# Patient Record
Sex: Male | Born: 1985 | Race: Black or African American | Hispanic: No | Marital: Single | Smoking: Current every day smoker
Health system: Southern US, Community
[De-identification: ages and names within clinical notes are randomized; demographics above are authoritative.]

## PROBLEM LIST (undated history)

## (undated) DIAGNOSIS — W3400XA Accidental discharge from unspecified firearms or gun, initial encounter: Secondary | ICD-10-CM

## (undated) HISTORY — PX: OTHER SURGICAL HISTORY: SHX169

---
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
## 2003-08-09 ENCOUNTER — Emergency Department (HOSPITAL_COMMUNITY): Admission: EM | Admit: 2003-08-09 | Discharge: 2003-08-10 | Payer: Self-pay | Admitting: Emergency Medicine

## 2004-04-16 ENCOUNTER — Emergency Department (HOSPITAL_COMMUNITY): Admission: EM | Admit: 2004-04-16 | Discharge: 2004-04-16 | Payer: Self-pay | Admitting: Emergency Medicine

## 2005-06-10 ENCOUNTER — Emergency Department (HOSPITAL_COMMUNITY): Admission: EM | Admit: 2005-06-10 | Discharge: 2005-06-10 | Payer: Self-pay | Admitting: Emergency Medicine

## 2005-10-15 ENCOUNTER — Emergency Department (HOSPITAL_COMMUNITY): Admission: EM | Admit: 2005-10-15 | Discharge: 2005-10-15 | Payer: Self-pay | Admitting: Emergency Medicine

## 2006-07-26 ENCOUNTER — Emergency Department (HOSPITAL_COMMUNITY): Admission: EM | Admit: 2006-07-26 | Discharge: 2006-07-26 | Payer: Self-pay | Admitting: Emergency Medicine

## 2014-05-04 ENCOUNTER — Emergency Department (HOSPITAL_COMMUNITY)
Admission: EM | Admit: 2014-05-04 | Discharge: 2014-05-04 | Disposition: A | Discharge status: Home / Self Care | Payer: Self-pay | Attending: Emergency Medicine | Admitting: Emergency Medicine

## 2014-05-04 ENCOUNTER — Encounter (HOSPITAL_COMMUNITY): Payer: Self-pay | Admitting: *Deleted

## 2014-05-04 DIAGNOSIS — Z202 Contact with and (suspected) exposure to infections with a predominantly sexual mode of transmission: Secondary | ICD-10-CM | POA: Insufficient documentation

## 2014-05-04 DIAGNOSIS — R109 Unspecified abdominal pain: Secondary | ICD-10-CM | POA: Insufficient documentation

## 2014-05-04 DIAGNOSIS — N342 Other urethritis: Secondary | ICD-10-CM

## 2014-05-04 DIAGNOSIS — Z720 Tobacco use: Secondary | ICD-10-CM | POA: Insufficient documentation

## 2014-05-04 DIAGNOSIS — A64 Unspecified sexually transmitted disease: Secondary | ICD-10-CM

## 2014-05-04 DIAGNOSIS — R1110 Vomiting, unspecified: Secondary | ICD-10-CM

## 2014-05-04 DIAGNOSIS — R112 Nausea with vomiting, unspecified: Secondary | ICD-10-CM | POA: Insufficient documentation

## 2014-05-04 DIAGNOSIS — Z880 Allergy status to penicillin: Secondary | ICD-10-CM | POA: Insufficient documentation

## 2014-05-04 HISTORY — DX: Accidental discharge from unspecified firearms or gun, initial encounter: W34.00XA

## 2014-05-04 LAB — CBC
HEMATOCRIT: 42.2 % (ref 39.0–52.0)
Hemoglobin: 14 g/dL (ref 13.0–17.0)
MCH: 28.5 pg (ref 26.0–34.0)
MCHC: 33.2 g/dL (ref 30.0–36.0)
MCV: 85.8 fL (ref 78.0–100.0)
Platelets: 237 10*3/uL (ref 150–400)
RBC: 4.92 MIL/uL (ref 4.22–5.81)
RDW: 11.9 % (ref 11.5–15.5)
WBC: 6.8 10*3/uL (ref 4.0–10.5)

## 2014-05-04 LAB — I-STAT CHEM 8, ED
BUN: 12 mg/dL (ref 6–23)
CREATININE: 1.2 mg/dL (ref 0.50–1.35)
Calcium, Ion: 1.18 mmol/L (ref 1.12–1.23)
Chloride: 105 mEq/L (ref 96–112)
Glucose, Bld: 98 mg/dL (ref 70–99)
HEMATOCRIT: 46 % (ref 39.0–52.0)
HEMOGLOBIN: 15.6 g/dL (ref 13.0–17.0)
POTASSIUM: 3.6 meq/L — AB (ref 3.7–5.3)
SODIUM: 142 meq/L (ref 137–147)
TCO2: 26 mmol/L (ref 0–100)

## 2014-05-04 LAB — URINALYSIS, ROUTINE W REFLEX MICROSCOPIC
Bilirubin Urine: NEGATIVE
Glucose, UA: NEGATIVE mg/dL
HGB URINE DIPSTICK: NEGATIVE
Ketones, ur: NEGATIVE mg/dL
Leukocytes, UA: NEGATIVE
NITRITE: NEGATIVE
Protein, ur: NEGATIVE mg/dL
SPECIFIC GRAVITY, URINE: 1.025 (ref 1.005–1.030)
Urobilinogen, UA: 0.2 mg/dL (ref 0.0–1.0)
pH: 5.5 (ref 5.0–8.0)

## 2014-05-04 MED ORDER — DOXYCYCLINE HYCLATE 100 MG PO CAPS: 100.0000 mg | ORAL_CAPSULE | Freq: Two times a day (BID) | ORAL | Status: DC

## 2014-05-04 MED ORDER — PROMETHAZINE HCL 25 MG PO TABS: 25.0000 mg | ORAL_TABLET | Freq: Four times a day (QID) | ORAL | Status: DC | PRN

## 2014-05-04 MED ORDER — AZITHROMYCIN 250 MG PO TABS
1000.0000 mg | ORAL_TABLET | Freq: Once | ORAL | Status: AC
  Administered 2014-05-04: 1000 mg via ORAL
  Filled 2014-05-04: qty 4

## 2014-05-04 MED ORDER — LIDOCAINE HCL (PF) 1 % IJ SOLN
0.9000 mL | Freq: Once | INTRAMUSCULAR | Status: AC
  Administered 2014-05-04: 0.9 mL
  Filled 2014-05-04: qty 5

## 2014-05-04 MED ORDER — METRONIDAZOLE 500 MG PO TABS
2000.0000 mg | ORAL_TABLET | Freq: Once | ORAL | Status: AC
  Administered 2014-05-04: 2000 mg via ORAL
  Filled 2014-05-04: qty 4

## 2014-05-04 MED ORDER — CEFTRIAXONE SODIUM 250 MG IJ SOLR
250.0000 mg | Freq: Once | INTRAMUSCULAR | Status: AC
  Administered 2014-05-04: 250 mg via INTRAMUSCULAR
  Filled 2014-05-04: qty 250

## 2014-05-04 NOTE — ED Notes (Signed)
Vomiting x 2 days. Penile d/c. Yellow drainage. Sexually active, unprotected sex.

## 2014-05-04 NOTE — ED Notes (Signed)
No adverse reaction to medications/injection

## 2014-05-04 NOTE — Discharge Instructions (Signed)
Nausea and Vomiting Nausea is a sick feeling that often comes before throwing up (vomiting). Vomiting is a reflex where stomach contents come out of your mouth. Vomiting can cause severe loss of body fluids (dehydration). Children and elderly adults can become dehydrated quickly, especially if they also have diarrhea. Nausea and vomiting are symptoms of a condition or disease. It is important to find the cause of your symptoms. CAUSES   Direct irritation of the stomach lining. This irritation can result from increased acid production (gastroesophageal reflux disease), infection, food poisoning, taking certain medicines (such as nonsteroidal anti-inflammatory drugs), alcohol use, or tobacco use.  Signals from the brain.These signals could be caused by a headache, heat exposure, an inner ear disturbance, increased pressure in the brain from injury, infection, a tumor, or a concussion, pain, emotional stimulus, or metabolic problems.  An obstruction in the gastrointestinal tract (bowel obstruction).  Illnesses such as diabetes, hepatitis, gallbladder problems, appendicitis, kidney problems, cancer, sepsis, atypical symptoms of a heart attack, or eating disorders.  Medical treatments such as chemotherapy and radiation.  Receiving medicine that makes you sleep (general anesthetic) during surgery. DIAGNOSIS Your caregiver may ask for tests to be done if the problems do not improve after a few days. Tests may also be done if symptoms are severe or if the reason for the nausea and vomiting is not clear. Tests may include:  Urine tests.  Blood tests.  Stool tests.  Cultures (to look for evidence of infection).  X-rays or other imaging studies. Test results can help your caregiver make decisions about treatment or the need for additional tests. TREATMENT You need to stay well hydrated. Drink frequently but in small amounts.You may wish to drink water, sports drinks, clear broth, or eat frozen  ice pops or gelatin dessert to help stay hydrated.When you eat, eating slowly may help prevent nausea.There are also some antinausea medicines that may help prevent nausea. HOME CARE INSTRUCTIONS   Take all medicine as directed by your caregiver.  If you do not have an appetite, do not force yourself to eat. However, you must continue to drink fluids.  If you have an appetite, eat a normal diet unless your caregiver tells you differently.  Eat a variety of complex carbohydrates (rice, wheat, potatoes, bread), lean meats, yogurt, fruits, and vegetables.  Avoid high-fat foods because they are more difficult to digest.  Drink enough water and fluids to keep your urine clear or pale yellow.  If you are dehydrated, ask your caregiver for specific rehydration instructions. Signs of dehydration may include:  Severe thirst.  Dry lips and mouth.  Dizziness.  Dark urine.  Decreasing urine frequency and amount.  Confusion.  Rapid breathing or pulse. SEEK IMMEDIATE MEDICAL CARE IF:   You have blood or brown flecks (like coffee grounds) in your vomit.  You have black or bloody stools.  You have a severe headache or stiff neck.  You are confused.  You have severe abdominal pain.  You have chest pain or trouble breathing.  You do not urinate at least once every 8 hours.  You develop cold or clammy skin.  You continue to vomit for longer than 24 to 48 hours.  You have a fever. MAKE SURE YOU:   Understand these instructions.  Will watch your condition.  Will get help right away if you are not doing well or get worse. Document Released: 06/04/2005 Document Revised: 08/27/2011 Document Reviewed: 11/01/2010 ExitCare Patient Information 2015 [employer]. This information is not intended   to replace advice given to you by your health care provider. Make sure you discuss any questions you have with your health care provider.  Sexually Transmitted Disease A sexually  transmitted disease (STD) is a disease or infection often passed to another person during sex. However, STDs can be passed through nonsexual ways. An STD can be passed through:  Spit (saliva).  Semen.  Blood.  Mucus from the vagina.  Pee (urine). HOW CAN I LESSEN MY CHANCES OF GETTING AN STD?  Use:  Latex condoms.  Water-soluble lubricants with condoms. Do not use petroleum jelly or oils.  Dental dams. These are small pieces of latex that are used as a barrier during oral sex.  Avoid having more than one sex partner.  Do not have sex with someone who has other sex partners.  Do not have sex with anyone you do not know or who is at high risk for an STD.  Avoid risky sex that can break your skin.  Do not have sex if you have open sores on your mouth or skin.  Avoid drinking too much alcohol or taking illegal drugs. Alcohol and drugs can affect your good judgment.  Avoid oral and anal sex acts.  Get shots (vaccines) for HPV and hepatitis.  If you are at risk of being infected with HIV, it is advised that you take a certain medicine daily to prevent HIV infection. This is called pre-exposure prophylaxis (PrEP). You may be at risk if:  You are a man who has sex with other men (MSM).  You are attracted to the opposite sex (heterosexual) and are having sex with more than one partner.  You take drugs with a needle.  You have sex with someone who has HIV.  Talk with your doctor about if you are at high risk of being infected with HIV. If you begin to take PrEP, get tested for HIV first. Get tested every 3 months for as long as you are taking PrEP. WHAT SHOULD I DO IF I THINK I HAVE AN STD?  See your doctor.  Tell your sex partner(s) that you have an STD. They should be tested and treated.  Do not have sex until your doctor says it is okay. WHEN SHOULD I GET HELP? Get help right away if:  You have bad belly (abdominal) pain.  You are a man and have puffiness  (swelling) or pain in your testicles.  You are a woman and have puffiness in your vagina. Document Released: 07/12/2004 Document Revised: 06/09/2013 Document Reviewed: 11/28/2012 [facility] Patient Information 2015 [employer], [place]. This information is not intended to replace advice given to you by your health care provider. Make sure you discuss any questions you have with your health care provider.
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
## 2014-06-09 ENCOUNTER — Emergency Department (HOSPITAL_COMMUNITY): Payer: Self-pay

## 2014-06-09 ENCOUNTER — Encounter (HOSPITAL_COMMUNITY): Payer: Self-pay | Admitting: Emergency Medicine

## 2014-06-09 ENCOUNTER — Emergency Department (HOSPITAL_COMMUNITY)
Admission: EM | Admit: 2014-06-09 | Discharge: 2014-06-09 | Disposition: A | Discharge status: Home / Self Care | Payer: Self-pay | Attending: Emergency Medicine | Admitting: Emergency Medicine

## 2014-06-09 DIAGNOSIS — Z720 Tobacco use: Secondary | ICD-10-CM | POA: Insufficient documentation

## 2014-06-09 DIAGNOSIS — Y92410 Unspecified street and highway as the place of occurrence of the external cause: Secondary | ICD-10-CM | POA: Insufficient documentation

## 2014-06-09 DIAGNOSIS — Y9389 Activity, other specified: Secondary | ICD-10-CM | POA: Insufficient documentation

## 2014-06-09 DIAGNOSIS — S0001XA Abrasion of scalp, initial encounter: Secondary | ICD-10-CM | POA: Insufficient documentation

## 2014-06-09 DIAGNOSIS — S060X9A Concussion with loss of consciousness of unspecified duration, initial encounter: Secondary | ICD-10-CM

## 2014-06-09 DIAGNOSIS — Z880 Allergy status to penicillin: Secondary | ICD-10-CM | POA: Insufficient documentation

## 2014-06-09 DIAGNOSIS — Z87828 Personal history of other (healed) physical injury and trauma: Secondary | ICD-10-CM | POA: Insufficient documentation

## 2014-06-09 DIAGNOSIS — Z792 Long term (current) use of antibiotics: Secondary | ICD-10-CM | POA: Insufficient documentation

## 2014-06-09 DIAGNOSIS — T148 Other injury of unspecified body region: Secondary | ICD-10-CM | POA: Insufficient documentation

## 2014-06-09 DIAGNOSIS — Y998 Other external cause status: Secondary | ICD-10-CM | POA: Insufficient documentation

## 2014-06-09 DIAGNOSIS — S0081XA Abrasion of other part of head, initial encounter: Secondary | ICD-10-CM | POA: Insufficient documentation

## 2014-06-09 DIAGNOSIS — T148XXA Other injury of unspecified body region, initial encounter: Secondary | ICD-10-CM

## 2014-06-09 DIAGNOSIS — S060X0A Concussion without loss of consciousness, initial encounter: Secondary | ICD-10-CM | POA: Insufficient documentation

## 2014-06-09 LAB — COMPREHENSIVE METABOLIC PANEL
ALT: 27 U/L (ref 0–53)
AST: 32 U/L (ref 0–37)
Albumin: 4.6 g/dL (ref 3.5–5.2)
Alkaline Phosphatase: 65 U/L (ref 39–117)
Anion gap: 12 (ref 5–15)
BUN: 8 mg/dL (ref 6–23)
CO2: 25 mmol/L (ref 19–32)
Calcium: 9.6 mg/dL (ref 8.4–10.5)
Chloride: 101 mEq/L (ref 96–112)
Creatinine, Ser: 1.45 mg/dL — ABNORMAL HIGH (ref 0.50–1.35)
GFR calc Af Amer: 74 mL/min — ABNORMAL LOW (ref >90)
GFR calc non Af Amer: 64 mL/min — ABNORMAL LOW (ref >90)
Glucose, Bld: 106 mg/dL — ABNORMAL HIGH (ref 70–99)
POTASSIUM: 3 mmol/L — AB (ref 3.5–5.1)
SODIUM: 138 mmol/L (ref 135–145)
TOTAL PROTEIN: 7.8 g/dL (ref 6.0–8.3)
Total Bilirubin: 1 mg/dL (ref 0.3–1.2)

## 2014-06-09 LAB — CBC WITH DIFFERENTIAL/PLATELET
Basophils Absolute: 0 10*3/uL (ref 0.0–0.1)
Basophils Relative: 0 % (ref 0–1)
EOS ABS: 0.3 10*3/uL (ref 0.0–0.7)
Eosinophils Relative: 4 % (ref 0–5)
HEMATOCRIT: 43.6 % (ref 39.0–52.0)
HEMOGLOBIN: 15.4 g/dL (ref 13.0–17.0)
Lymphocytes Relative: 27 % (ref 12–46)
Lymphs Abs: 1.8 10*3/uL (ref 0.7–4.0)
MCH: 30 pg (ref 26.0–34.0)
MCHC: 35.3 g/dL (ref 30.0–36.0)
MCV: 85 fL (ref 78.0–100.0)
MONO ABS: 1 10*3/uL (ref 0.1–1.0)
MONOS PCT: 15 % — AB (ref 3–12)
NEUTROS ABS: 3.5 10*3/uL (ref 1.7–7.7)
Neutrophils Relative %: 54 % (ref 43–77)
Platelets: 264 10*3/uL (ref 150–400)
RBC: 5.13 MIL/uL (ref 4.22–5.81)
RDW: 11.7 % (ref 11.5–15.5)
WBC: 6.5 10*3/uL (ref 4.0–10.5)

## 2014-06-09 LAB — ETHANOL: Alcohol, Ethyl (B): <5 mg/dL (ref 0–9)

## 2014-06-09 MED ORDER — SODIUM CHLORIDE 0.9 % IV BOLUS (SEPSIS)
1000.0000 mL | Freq: Once | INTRAVENOUS | Status: AC
  Administered 2014-06-09: 1000 mL via INTRAVENOUS

## 2014-06-09 MED ORDER — TETANUS-DIPHTH-ACELL PERTUSSIS 5-2.5-18.5 LF-MCG/0.5 IM SUSP
0.5000 mL | Freq: Once | INTRAMUSCULAR | Status: DC
  Filled 2014-06-09: qty 0.5

## 2014-06-09 MED ORDER — IBUPROFEN 800 MG PO TABS: 800.0000 mg | ORAL_TABLET | Freq: Three times a day (TID) | ORAL | Status: DC

## 2014-06-09 MED ORDER — METHOCARBAMOL 750 MG PO TABS: 750.0000 mg | ORAL_TABLET | Freq: Three times a day (TID) | ORAL | Status: DC | PRN

## 2014-06-09 MED ORDER — MORPHINE SULFATE 4 MG/ML IJ SOLN
4.0000 mg | Freq: Once | INTRAMUSCULAR | Status: AC
Start: 2014-06-09 — End: 2014-06-09
  Administered 2014-06-09: 4 mg via INTRAVENOUS
  Filled 2014-06-09: qty 1

## 2014-06-09 MED ORDER — ONDANSETRON HCL 4 MG/2ML IJ SOLN
4.0000 mg | Freq: Once | INTRAMUSCULAR | Status: AC
  Administered 2014-06-09: 4 mg via INTRAVENOUS
  Filled 2014-06-09: qty 2

## 2014-06-09 MED ORDER — HYDROCODONE-ACETAMINOPHEN 5-325 MG PO TABS: 2.0000 | ORAL_TABLET | ORAL | Status: DC | PRN
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
## 2014-06-09 NOTE — ED Notes (Signed)
[employer] and Security at the room.

## 2014-06-09 NOTE — ED Notes (Signed)
Patient returned from xray.
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
## 2014-06-09 NOTE — ED Notes (Signed)
Breakfast Tray ordered  
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
## 2014-06-09 NOTE — ED Notes (Signed)
Pt. Refused to be ambulated out of the room.

## 2015-02-04 ENCOUNTER — Encounter (HOSPITAL_COMMUNITY): Payer: Self-pay | Admitting: Nurse Practitioner

## 2015-02-04 ENCOUNTER — Emergency Department (HOSPITAL_COMMUNITY)
Admission: EM | Admit: 2015-02-04 | Discharge: 2015-02-05 | Disposition: A | Discharge status: Other Acute Inpatient Hospital | Payer: Self-pay | Attending: Emergency Medicine | Admitting: Emergency Medicine

## 2015-02-04 DIAGNOSIS — T39312A Poisoning by propionic acid derivatives, intentional self-harm, initial encounter: Secondary | ICD-10-CM | POA: Insufficient documentation

## 2015-02-04 DIAGNOSIS — T50902A Poisoning by unspecified drugs, medicaments and biological substances, intentional self-harm, initial encounter: Secondary | ICD-10-CM

## 2015-02-04 DIAGNOSIS — T404X2A Poisoning by other synthetic narcotics, intentional self-harm, initial encounter: Secondary | ICD-10-CM | POA: Insufficient documentation

## 2015-02-04 DIAGNOSIS — Z791 Long term (current) use of non-steroidal anti-inflammatories (NSAID): Secondary | ICD-10-CM | POA: Insufficient documentation

## 2015-02-04 DIAGNOSIS — Z720 Tobacco use: Secondary | ICD-10-CM | POA: Insufficient documentation

## 2015-02-04 DIAGNOSIS — Y939 Activity, unspecified: Secondary | ICD-10-CM | POA: Insufficient documentation

## 2015-02-04 DIAGNOSIS — Y929 Unspecified place or not applicable: Secondary | ICD-10-CM | POA: Insufficient documentation

## 2015-02-04 DIAGNOSIS — Y999 Unspecified external cause status: Secondary | ICD-10-CM | POA: Insufficient documentation

## 2015-02-04 DIAGNOSIS — Z87828 Personal history of other (healed) physical injury and trauma: Secondary | ICD-10-CM | POA: Insufficient documentation

## 2015-02-04 DIAGNOSIS — Z880 Allergy status to penicillin: Secondary | ICD-10-CM | POA: Insufficient documentation

## 2015-02-04 DIAGNOSIS — F329 Major depressive disorder, single episode, unspecified: Secondary | ICD-10-CM | POA: Insufficient documentation

## 2015-02-04 DIAGNOSIS — X58XXXA Exposure to other specified factors, initial encounter: Secondary | ICD-10-CM | POA: Insufficient documentation

## 2015-02-04 LAB — RAPID URINE DRUG SCREEN, HOSP PERFORMED
Amphetamines: NOT DETECTED
Barbiturates: NOT DETECTED
Benzodiazepines: NOT DETECTED
COCAINE: NOT DETECTED
OPIATES: NOT DETECTED
TETRAHYDROCANNABINOL: NOT DETECTED

## 2015-02-04 LAB — CBC WITH DIFFERENTIAL/PLATELET
Basophils Absolute: 0 10*3/uL (ref 0.0–0.1)
Basophils Relative: 0 % (ref 0–1)
EOS PCT: 2 % (ref 0–5)
Eosinophils Absolute: 0.2 10*3/uL (ref 0.0–0.7)
HCT: 40.5 % (ref 39.0–52.0)
Hemoglobin: 13.9 g/dL (ref 13.0–17.0)
LYMPHS ABS: 3 10*3/uL (ref 0.7–4.0)
LYMPHS PCT: 35 % (ref 12–46)
MCH: 29.5 pg (ref 26.0–34.0)
MCHC: 34.3 g/dL (ref 30.0–36.0)
MCV: 86 fL (ref 78.0–100.0)
MONO ABS: 0.8 10*3/uL (ref 0.1–1.0)
Monocytes Relative: 9 % (ref 3–12)
Neutro Abs: 4.6 10*3/uL (ref 1.7–7.7)
Neutrophils Relative %: 54 % (ref 43–77)
PLATELETS: 229 10*3/uL (ref 150–400)
RBC: 4.71 MIL/uL (ref 4.22–5.81)
RDW: 11.5 % (ref 11.5–15.5)
WBC: 8.6 10*3/uL (ref 4.0–10.5)

## 2015-02-04 LAB — COMPREHENSIVE METABOLIC PANEL
ALT: 17 U/L (ref 17–63)
AST: 22 U/L (ref 15–41)
Albumin: 4.2 g/dL (ref 3.5–5.0)
Alkaline Phosphatase: 53 U/L (ref 38–126)
Anion gap: 9 (ref 5–15)
BUN: 9 mg/dL (ref 6–20)
CHLORIDE: 106 mmol/L (ref 101–111)
CO2: 26 mmol/L (ref 22–32)
Calcium: 9.2 mg/dL (ref 8.9–10.3)
Creatinine, Ser: 1.1 mg/dL (ref 0.61–1.24)
GFR calc Af Amer: >60 mL/min (ref >60)
Glucose, Bld: 111 mg/dL — ABNORMAL HIGH (ref 65–99)
POTASSIUM: 3.3 mmol/L — AB (ref 3.5–5.1)
Sodium: 141 mmol/L (ref 135–145)
Total Bilirubin: 0.9 mg/dL (ref 0.3–1.2)
Total Protein: 6.9 g/dL (ref 6.5–8.1)

## 2015-02-04 LAB — ETHANOL

## 2015-02-04 LAB — SALICYLATE LEVEL: Salicylate Lvl: <4 mg/dL (ref 2.8–30.0)

## 2015-02-04 LAB — ACETAMINOPHEN LEVEL: Acetaminophen (Tylenol), Serum: <10 ug/mL — ABNORMAL LOW (ref 10–30)

## 2015-02-04 NOTE — ED Notes (Signed)
MD at bedside. 
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
## 2015-02-04 NOTE — ED Notes (Signed)
Pt family states he took 91  tramadol pills, 6-7  ibuprofen pills, and an unknown amount of naproxen this morning in an attempt for suicide. He denies HI. He is lethargic but breathing easily now. He denies physical complaints. He denies substance use.

## 2015-02-04 NOTE — ED Notes (Addendum)
Poison control contacted.  Advised to monitor for GI upset, bradycardia, hypotension, respiratory depression.  Monitor for 4-6 hrs and check Acetaminophen level. Adela Lank MD made aware of recommendations.
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
## 2015-02-04 NOTE — BH Assessment (Signed)
Received notification of TTS consult request. Spoke to Melene Plan, DO who said Pt overdosed on medications in a suicide attempt. Tele-assessment will be initiated.  Harlin Rain Patsy [place], LPC, [facility], [facility] Triage Specialist [phone]
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
## 2015-02-05 ENCOUNTER — Inpatient Hospital Stay (HOSPITAL_COMMUNITY)
Admission: AD | Admit: 2015-02-05 | Discharge: 2015-02-06 | DRG: 882 | Disposition: A | Discharge status: Home / Self Care | Payer: Federal, State, Local not specified - Other | Source: Intra-hospital | Attending: Psychiatry | Admitting: Psychiatry

## 2015-02-05 ENCOUNTER — Encounter (HOSPITAL_COMMUNITY): Payer: Self-pay

## 2015-02-05 DIAGNOSIS — Z9079 Acquired absence of other genital organ(s): Secondary | ICD-10-CM | POA: Diagnosis present

## 2015-02-05 DIAGNOSIS — T6592XA Toxic effect of unspecified substance, intentional self-harm, initial encounter: Secondary | ICD-10-CM

## 2015-02-05 DIAGNOSIS — F4323 Adjustment disorder with mixed anxiety and depressed mood: Secondary | ICD-10-CM | POA: Diagnosis not present

## 2015-02-05 DIAGNOSIS — F17210 Nicotine dependence, cigarettes, uncomplicated: Secondary | ICD-10-CM | POA: Diagnosis present

## 2015-02-05 DIAGNOSIS — F439 Reaction to severe stress, unspecified: Secondary | ICD-10-CM | POA: Diagnosis present

## 2015-02-05 DIAGNOSIS — F329 Major depressive disorder, single episode, unspecified: Secondary | ICD-10-CM | POA: Insufficient documentation

## 2015-02-05 DIAGNOSIS — T50902A Poisoning by unspecified drugs, medicaments and biological substances, intentional self-harm, initial encounter: Secondary | ICD-10-CM

## 2015-02-05 DIAGNOSIS — F32A Depression, unspecified: Secondary | ICD-10-CM | POA: Insufficient documentation

## 2015-02-05 DIAGNOSIS — F430 Acute stress reaction: Secondary | ICD-10-CM

## 2015-02-05 MED ORDER — ALUM & MAG HYDROXIDE-SIMETH 200-200-20 MG/5ML PO SUSP: 30.0000 mL | ORAL | Status: DC | PRN

## 2015-02-05 MED ORDER — DOXYCYCLINE HYCLATE 100 MG PO TABS
100.0000 mg | ORAL_TABLET | Freq: Two times a day (BID) | ORAL | Status: DC
  Filled 2015-02-05 (×3): qty 1

## 2015-02-05 MED ORDER — POTASSIUM CHLORIDE CRYS ER 10 MEQ PO TBCR
10.0000 meq | EXTENDED_RELEASE_TABLET | Freq: Two times a day (BID) | ORAL | Status: DC
  Filled 2015-02-05 (×4): qty 1

## 2015-02-05 MED ORDER — ACETAMINOPHEN 325 MG PO TABS: 650.0000 mg | ORAL_TABLET | Freq: Four times a day (QID) | ORAL | Status: DC | PRN

## 2015-02-05 MED ORDER — MAGNESIUM HYDROXIDE 400 MG/5ML PO SUSP
30.0000 mL | Freq: Every day | ORAL | Status: DC | PRN
  Administered 2015-02-06: 30 mL via ORAL
  Filled 2015-02-05: qty 30

## 2015-02-05 NOTE — Progress Notes (Signed)
Adult Psychoeducational Group Note  Date:  02/05/2015 Time:  10:08 PM  Group Topic/Focus:  Wrap-Up Group:   The focus of this group is to help patients review their daily goal of treatment and discuss progress on daily workbooks.  Participation Level:  Active  Participation Quality:  Appropriate  Affect:  Appropriate  Cognitive:  Appropriate  Insight: Appropriate  Engagement in Group:  Engaged  Modes of Intervention:  Discussion  Additional Comments:  The patient expressed the he had a rough day.The patient also said that he did not attended any groups.  Octavio Manns 02/05/2015, 10:08 PM
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
## 2015-02-05 NOTE — Tx Team (Signed)
Initial Interdisciplinary Treatment Plan   PATIENT STRESSORS: did not disclose any stressors to Clinical research associate.    PATIENT STRENGTHS: Average or above average intelligence General fund of knowledge Physical Health   PROBLEM LIST: Problem List/Patient Goals Date to be addressed Date deferred Reason deferred Estimated date of resolution  " to go home" 8/20     Increased risk for SI 8/20                                                 DISCHARGE CRITERIA:  Improved stabilization in mood, thinking, and/or behavior Need for constant or close observation no longer present Reduction of life-threatening or endangering symptoms to within safe limits  PRELIMINARY DISCHARGE PLAN: Outpatient therapy Return to previous living arrangement Return to previous work or school arrangements  PATIENT/FAMIILY INVOLVEMENT: This treatment plan has been presented to and reviewed with the patient, Patrick Oneill.  The patient and family have been given the opportunity to ask questions and make suggestions.  Tymesha Ditmore A 02/05/2015, 5:44 AM
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
## 2015-02-05 NOTE — [facility] Suicide Risk Assessment (Signed)
[facility] Admission Suicide Risk Assessment   Nursing information obtained from:  Patient Demographic factors:  Male Current Mental Status:  Self-harm behaviors Loss Factors:  Legal issues, Financial problems / change in socioeconomic status Historical Factors:  Prior suicide attempts Risk Reduction Factors:  Responsible for children under 31 years of age, Living with another person, especially a relative Total Time spent with patient: 30 minutes Principal Problem: Adjustment disorder with mixed anxiety and depressed mood Diagnosis:   Patient Active Problem List   Diagnosis Date Noted  . Suicide attempt by substance overdose [T65.92XA] 02/05/2015  . Ineffective individual coping [F43.0] 02/05/2015  . Adjustment disorder with mixed anxiety and depressed mood [F43.23] 02/05/2015     Continued Clinical Symptoms:  Alcohol Use Disorder Identification Test Final Score (AUDIT): 0 The "Alcohol Use Disorders Identification Test", Guidelines for Use in Primary Care, Second Edition.  World Science writer [facility]). Score between 0-7:  no or low risk or alcohol related problems. Score between 8-15:  moderate risk of alcohol related problems. Score between 16-19:  high risk of alcohol related problems. Score 20 or above:  warrants further diagnostic evaluation for alcohol dependence and treatment.   CLINICAL FACTORS:   Alcohol/Substance Abuse/Dependencies   Musculoskeletal: Strength & Muscle Tone: within normal limits Gait & Station: normal Patient leans: Right  Psychiatric Specialty Exam: Physical Exam  Review of Systems  Psychiatric/Behavioral: Positive for substance abuse. Negative for depression, suicidal ideas and hallucinations. The patient is not nervous/anxious.   All other systems reviewed and are negative.   Blood pressure 132/85, pulse 67, temperature 97.6 F (36.4 C), temperature source Oral, resp. rate 18.There is no weight on file to calculate BMI.  General Appearance:  Fairly Groomed  Patent attorney::  Good  Speech:  Clear and Coherent  Volume:  Normal  Mood:  Euthymic  Affect:  Appropriate  Thought Process:  Coherent  Orientation:  Full (Time, Place, and Person)  Thought Content:  WDL  Suicidal Thoughts:  No s/p od  Homicidal Thoughts:  No  Memory:  Immediate;   Fair Recent;   Fair Remote;   Fair  Judgement:  Impaired  Insight:  Fair  Psychomotor Activity:  Normal  Concentration:  Fair  Recall:  [employer] of Knowledge:Fair  Language: Fair  Akathisia:  No  Handed:  Right  AIMS (if indicated):     Assets:  Communication Skills Desire for Improvement  Sleep:     Cognition: WNL  ADL's:  Intact     COGNITIVE FEATURES THAT CONTRIBUTE TO RISK:  Closed-mindedness, Polarized thinking and Thought constriction (tunnel vision)    SUICIDE RISK:   Mild:  Suicidal ideation of limited frequency, intensity, duration, and specificity.  There are no identifiable plans, no associated intent, mild dysphoria and related symptoms, good self-control (both objective and subjective assessment), few other risk factors, and identifiable protective factors, including available and accessible social support.  PLAN OF CARE: Patient will benefit from inpatient treatment and stabilization.  Estimated length of stay is 5-7 days.  Reviewed past medical records,treatment plan.  Pt at this time is not open to any medications - states he OD ed on pills impulsively and it was not to kill self.   Will continue to monitor vitals ,medication compliance and treatment side effects while patient is here.  Will monitor for medical issues as well as call consult as needed.  Reviewed labs cbc, cmp - k low, uds neg. Replace Kdur 10 meq x2 doses , repeat BMP . CSW will  start working on disposition.  Patient to participate in therapeutic milieu .       Medical Decision Making:  Review of Psycho-Social Stressors (1), Review or order clinical lab tests (1), Review of Last Therapy  Session (1), Review of Medication Regimen & Side Effects (2) and Review of New Medication or Change in Dosage (2)  I certify that inpatient services furnished can reasonably be expected to improve the patient's condition.   Maguire Sime MD 02/05/2015, 2:57 PM

## 2015-02-05 NOTE — [facility] Group Notes (Signed)
[facility] Group Notes: (Clinical Social Work)   02/05/2015      Type of Therapy:  Group Therapy   Participation Level:  Did Not Attend despite MHT prompting   Ambrose Mantle, LCSW 02/05/2015, 4:33 PM

## 2015-02-05 NOTE — Plan of Care (Signed)
Problem: Ineffective individual coping Goal: STG: Patient will remain free from self harm Outcome: Progressing Q 15 minutes checks remains effective without gestures or event of self injurious behavior to note at this time. Pt safe on and off the unit.
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
## 2015-02-05 NOTE — [facility] Group Notes (Signed)
[facility] Group Notes:  (Nursing/Healthy Coping Skills)  Date:  02/05/2015  Time:  0930 Type of Therapy:  Nurse Education  Participation Level:  Did Not Attend  Summary of Progress/Problems: Pt did not attend scheduled group--Pt was completing his school work online in the treatment room as approved by [facility] who was 1:1 with him at the time.   Ouida Sills, Lincoln Maxin 02/05/2015, 0930

## 2015-02-05 NOTE — ED Notes (Signed)
The [facility] officer has arrived with ivc papers and is now waiting for other officers to transport the pt

## 2015-02-05 NOTE — Progress Notes (Signed)
D: Pt presents with angry affect and irritable /labile mood when approached. Pt denied SI, HI, AVH and pain when assessed. Pt has been oppositional, defensive and demanding D/C for most of this shift and has been uncooperative with care at intervals during shift. Pt refused all medications when offered. Stayed in treatment room earlier this shift on 1:1 observation with [facility] to complete school work on the computer.  A: 1:1 contact made with pt to conduct shift assessment. All medications offered to pt as prescribed with verbal education to promote compliance but to no avail as pt continued to refuse. Emotional support and availability offered to pt. Q 15 minutes checks maintained for safety. R: Pt remains safe on and off unit. Denies adverse drug reactions when assessed. Plan of care continues.

## 2015-02-05 NOTE — Progress Notes (Signed)
Writer spoke with patient briefly because he reports that he is discharging on tomorrow and he does not want any medications. He refused his lab draw even after writer explained the purpose of his lab. He denies si/hi/a/v hallucinations. Safety maintained on unit with 15 min checks.

## 2015-02-06 MED ORDER — HYDROXYZINE HCL 50 MG PO TABS
ORAL_TABLET | ORAL | Status: AC
  Filled 2015-02-06: qty 2

## 2015-02-06 MED ORDER — HYDROXYZINE HCL 25 MG PO TABS
25.0000 mg | ORAL_TABLET | Freq: Four times a day (QID) | ORAL | Status: DC | PRN
  Filled 2015-02-06: qty 10

## 2015-02-06 MED ORDER — HYDROXYZINE HCL 50 MG PO TABS
100.0000 mg | ORAL_TABLET | Freq: Four times a day (QID) | ORAL | Status: DC | PRN
  Filled 2015-02-06: qty 30

## 2015-02-06 MED ORDER — HYDROXYZINE PAMOATE 25 MG PO CAPS: 100.0000 mg | ORAL_CAPSULE | Freq: Four times a day (QID) | ORAL | Status: DC | PRN

## 2015-02-06 MED ORDER — HYDROXYZINE PAMOATE 25 MG PO CAPS: 25.0000 mg | ORAL_CAPSULE | Freq: Four times a day (QID) | ORAL | Status: AC | PRN

## 2015-02-06 MED ORDER — HYDROXYZINE HCL 25 MG PO TABS
25.0000 mg | ORAL_TABLET | Freq: Once | ORAL | Status: AC
  Administered 2015-02-06: 25 mg via ORAL

## 2015-02-06 MED ORDER — HYDROXYZINE HCL 25 MG PO TABS
ORAL_TABLET | ORAL | Status: AC
  Filled 2015-02-06: qty 1

## 2015-02-06 NOTE — Plan of Care (Signed)
Problem: Diagnosis: Increased Risk For Suicide Attempt Goal: STG-Patient Will Attend All Groups On The Unit Outcome: Progressing Patient attended wrap-up group but reports that he did not attend any other groups during the day.

## 2015-02-06 NOTE — Progress Notes (Signed)
Pair of tennis shoes with laces have been placed in his locker (locker #[id number]).

## 2015-02-06 NOTE — [facility] Suicide Risk Assessment (Signed)
[facility] INPATIENT:  Family/Significant Other Suicide Prevention Education  Suicide Prevention Education:  Education Completed with patient's wife, Rennis Golden at [phone] has been identified by the patient as the family member/significant other with whom the patient will be residing, and identified as the person(s) who will aid the patient in the event of a mental health crisis (suicidal ideations/suicide attempt).  With written consent from the patient, the family member/significant other has been provided the following suicide prevention education, prior to the and/or following the discharge of the patient. Patient's wife has spoken with patient and believes he had to intent to harm himself and is ready for discharge.   The suicide prevention education provided includes the following:  Suicide risk factors  Suicide prevention and interventions  National Suicide Hotline telephone number  [facility] assessment telephone number  [facility] Emergency Assistance 911  [facility] and/or Residential Mobile Crisis Unit telephone number  Request made of family/significant other to:  Remove weapons (e.g., guns, rifles, knives), all items previously/currently identified as safety concern.    Remove drugs/medications (over-the-counter, prescriptions, illicit drugs), all items previously/currently identified as a safety concern.  The family member/significant other verbalizes understanding of the suicide prevention education information provided.  The family member/significant other agrees to remove the items of safety concern listed above.  Clide Dales 02/06/2015, 9:30 AM
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
## 2015-02-06 NOTE — [facility] Suicide Risk Assessment (Signed)
[facility] Discharge Suicide Risk Assessment   Demographic Factors:  Male  Total Time spent with patient: 30 minutes  Musculoskeletal: Strength & Muscle Tone: within normal limits Gait & Station: normal Patient leans: N/A  Psychiatric Specialty Exam: Physical Exam  Review of Systems  Psychiatric/Behavioral: Negative for depression, suicidal ideas and hallucinations. The patient is not nervous/anxious.   All other systems reviewed and are negative.   Blood pressure 121/86, pulse 58, temperature 98.6 F (37 C), temperature source Oral, resp. rate 20.There is no weight on file to calculate BMI.  General Appearance: Casual  Eye Contact::  Fair  Speech:  Clear and Coherent409  Volume:  Normal  Mood:  Anxious  Affect:  Appropriate  Thought Process:  Coherent  Orientation:  Full (Time, Place, and Person)  Thought Content:  WDL  Suicidal Thoughts:  No  Homicidal Thoughts:  No  Memory:  Immediate;   Fair Recent;   Fair Remote;   Fair  Judgement:  Fair  Insight:  Shallow  Psychomotor Activity:  Normal  Concentration:  Fair  Recall:  [employer] of Knowledge:Fair  Language: Fair  Akathisia:  No  Handed:  Right  AIMS (if indicated):     Assets:  Communication Skills Desire for Improvement  Sleep:  Number of Hours: 6  Cognition: WNL  ADL's:  Intact   Have you used any form of tobacco in the last 30 days? (Cigarettes, Smokeless Tobacco, Cigars, and/or Pipes): Yes  Has this patient used any form of tobacco in the last 30 days? (Cigarettes, Smokeless Tobacco, Cigars, and/or Pipes) Yes, A prescription for an FDA-approved tobacco cessation medication was offered at discharge and the patient refused  Mental Status Per Nursing Assessment::   On Admission:  Self-harm behaviors  Current Mental Status by Physician: pt denies SI/HI/AH/VH  Loss Factors: NA  Historical Factors: Impulsivity  Risk Reduction Factors:   Positive social support  Continued Clinical Symptoms:   Alcohol/Substance Abuse/Dependencies  Cognitive Features That Contribute To Risk:  Thought constriction (tunnel vision)    Suicide Risk:  Minimal: No identifiable suicidal ideation.  Patients presenting with no risk factors but with morbid ruminations; may be classified as minimal risk based on the severity of the depressive symptoms  Principal Problem: Adjustment disorder with mixed anxiety and depressed mood Discharge Diagnoses:  Patient Active Problem List   Diagnosis Date Noted  . Suicide attempt by substance overdose [T65.92XA] 02/05/2015  . Ineffective individual coping [F43.0] 02/05/2015  . Adjustment disorder with mixed anxiety and depressed mood [F43.23] 02/05/2015    Follow-up Information    Go to [facility].   Specialty:  Behavioral Health   Why:  For followup assessment to get therapy appointments in place; be there BEFORE 1:00 PM on Friday (8/26 as Friday's are their slowest days)   Contact information:   [address] [place] [address] [phone]       Plan Of Care/Follow-up recommendations:  Activity:  No restrictions Diet:  regular Tests:  none Other:  follow up with after care  Is patient on multiple antipsychotic therapies at discharge:  No   Has Patient had three or more failed trials of antipsychotic monotherapy by history:  No  Recommended Plan for Multiple Antipsychotic Therapies: NA    Kynsli Haapala md 02/06/2015, 9:25 AM
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
## 2015-02-06 NOTE — [facility] Counselor (Signed)
Adult Comprehensive Assessment  Patient ID: Patrick Oneill, male   DOB: July 18, 1985, 29 Y.[place]   MRN: [id number]  Information Source: Information source: Patient  Current Stressors:  Educational / Learning stressors: Online course work is time consuming and stressful in addition to working full time plus overtime hours Employment / Job issues: Stress of working multiple shifts while also taking online classes Family Relationships: NA Surveyor, quantity / Lack of resources (include bankruptcy): "Okay, not great but okay" Housing / Lack of housing: NA Physical health (include injuries & life threatening diseases): NA Social relationships: Minimal time for social relationships other than church friends Substance abuse: NA; pt has court case in early September for selling drugs yet does not use himself Bereavement / Loss: NA  Living/Environment/Situation:  Living Arrangements: Spouse/significant other Living conditions (as described by patient or guardian): Pt reports both he and wife have separate dwellings; he spends majority of time at wife's How long has patient lived in current situation?: 8 months What is atmosphere in current home: Comfortable, Chaotic (some chaos with both work and school commitments)  Family History:  Marital status: Married Number of Years Married: 8 What types of issues is patient dealing with in the relationship?: NA; good support Does patient have children?: Yes How many children?: 1 How is patient's relationship with their children?: Good with 7 YO sone  Childhood History:  By whom was/is the patient raised?: Grandparents Additional childhood history information: Pt lived with grandmother for majority of childhood other than brief stay with mother that did not go well Description of patient's relationship with caregiver when they were a child: Good with Grandmother; minimal contact with bio mother Patient's description of current relationship with people who raised  him/her: Good with grandmother (currently in hospital re diabetes); no contact with bio mother Does patient have siblings?: Yes Number of Siblings: 7 Description of patient's current relationship with siblings: "Good with maybe one" Did patient suffer any verbal/emotional/physical/sexual abuse as a child?: No Did patient suffer from severe childhood neglect?: No Has patient ever been sexually abused/assaulted/raped as an adolescent or adult?: No Was the patient ever a victim of a crime or a disaster?: No Witnessed domestic violence?: No Has patient been effected by domestic violence as an adult?: No  Education:  Highest grade of school patient has completed: Some college Currently a student?: Yes If yes, how has current illness impacted academic performance: Missed classes Friday Name of school: [employer] Contact person: Patient How long has the patient attended?: 1 year Learning disability?: No  Employment/Work Situation:   Employment situation: Employed 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 How long has patient been employed?: 1 year Patient's job has been impacted by current illness: No ((other than missing work Friday 02/04/15)) What is the longest time patient has a held a job?: 2 years Has patient ever been in the [employer]?: No Has patient ever served in Buyer, retail?: No  Financial Resources:   Surveyor, quantity resources: Income from employment, Income from spouse Does patient have a representative payee or guardian?: No  Alcohol/Substance Abuse:   What has been your use of drugs/alcohol within the last 12 months?: Pt denies Alcohol/Substance Abuse Treatment Hx: Denies past history Has alcohol/substance abuse ever caused legal problems?: No (Pt does have pending court case (02/18/15) for sale of drugs but reports no drug use)  Social Support System:   Patient's Community Support System: Fair Development worker, community Support System: Wife Type of faith/religion:  Jehovah's Witness How does patient's faith help  to cope with current illness?: Church attendance and study helps  Leisure/Recreation:   Leisure and Hobbies: No time now with work, school, family and church  Strengths/Needs:   What things does the patient do well?: Primary school teacher, spouse, and student In what areas does patient struggle / problems for patient: Stress of 'doing it all'  Discharge Plan:   Does patient have access to transportation?: Yes Will patient be returning to same living situation after discharge?: Yes Currently receiving community mental health services: No If no, would patient like referral for services when discharged?: Yes (What county?) Medical sales representative) Does patient have financial barriers related to discharge medications?: Yes Patient description of barriers related to discharge medications: No insurance; given Associate Professor  Summary/Recommendations:   Summary and Recommendations (to be completed by the evaluator): 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 Patient denies any intention of self harm or suicide and reports he did take twice the amount of his usual medications than usual due to stress and desire to sleep and was unable to clearly communicate this while at ED with physician or social worker. Patient was to be discharged shortly after writer spoke with him to gather information; patient agreeable to share wife's contact information for CSW to call and provide safety information.  Patient refused referral to [facility] Quitline and refused to sign Discharge Process and Patient Expectations information sheet although CSW did go over informational sheet.     Clide Dales. 02/06/2015

## 2015-02-06 NOTE — Progress Notes (Signed)
  [facility] Adult Case Management Discharge Plan :  Will you be returning to the same living situation after discharge:  Yes,  home with wife At discharge, do you have transportation home?: Yes,  with wife. Do you have the ability to pay for your medications: Yes,  through reduced cost at [facility]  Release of information consent forms completed and in the chart;  Patient's signature needed at discharge.  Patient to Follow up at: Follow-up Information    Go to [facility].   Specialty:  Behavioral Health   Why:  For followup assessment to get therapy (and med management) appointments in place; be there BEFORE 1:00 PM on Friday (8/26 as Friday's are their slowest days)   Contact information:   [address] [place] [address] [phone]        Safety Planning and Suicide Prevention discussed: Yes,  individually with both patient and wife, Rennis Golden.  Have you used any form of tobacco in the last 30 days? (Cigarettes, Smokeless Tobacco, Cigars, and/or Pipes): Yes  Has patient been referred to the Quitline?: Patient refused referral  Clide Dales 02/06/2015, 9:32 AM

## 2015-02-27 ENCOUNTER — Encounter (HOSPITAL_COMMUNITY): Payer: Self-pay | Admitting: *Deleted

## 2015-02-27 ENCOUNTER — Emergency Department (HOSPITAL_COMMUNITY)
Admission: EM | Admit: 2015-02-27 | Discharge: 2015-02-27 | Disposition: A | Discharge status: Home / Self Care | Payer: Self-pay | Attending: Emergency Medicine | Admitting: Emergency Medicine

## 2015-02-27 DIAGNOSIS — W540XXA Bitten by dog, initial encounter: Secondary | ICD-10-CM | POA: Insufficient documentation

## 2015-02-27 DIAGNOSIS — Y9289 Other specified places as the place of occurrence of the external cause: Secondary | ICD-10-CM | POA: Insufficient documentation

## 2015-02-27 DIAGNOSIS — Z720 Tobacco use: Secondary | ICD-10-CM | POA: Insufficient documentation

## 2015-02-27 DIAGNOSIS — S0081XA Abrasion of other part of head, initial encounter: Secondary | ICD-10-CM | POA: Insufficient documentation

## 2015-02-27 DIAGNOSIS — Y998 Other external cause status: Secondary | ICD-10-CM | POA: Insufficient documentation

## 2015-02-27 DIAGNOSIS — Y9389 Activity, other specified: Secondary | ICD-10-CM | POA: Insufficient documentation

## 2015-02-27 DIAGNOSIS — S60511A Abrasion of right hand, initial encounter: Secondary | ICD-10-CM | POA: Insufficient documentation

## 2015-02-27 DIAGNOSIS — Z880 Allergy status to penicillin: Secondary | ICD-10-CM | POA: Insufficient documentation

## 2015-02-27 DIAGNOSIS — S61451A Open bite of right hand, initial encounter: Secondary | ICD-10-CM | POA: Insufficient documentation

## 2015-02-27 DIAGNOSIS — S0990XA Unspecified injury of head, initial encounter: Secondary | ICD-10-CM | POA: Insufficient documentation

## 2015-02-27 DIAGNOSIS — Z23 Encounter for immunization: Secondary | ICD-10-CM | POA: Insufficient documentation

## 2015-02-27 MED ORDER — TETANUS-DIPHTH-ACELL PERTUSSIS 5-2.5-18.5 LF-MCG/0.5 IM SUSP
0.5000 mL | Freq: Once | INTRAMUSCULAR | Status: AC
  Administered 2015-02-27: 0.5 mL via INTRAMUSCULAR
  Filled 2015-02-27: qty 0.5

## 2015-02-27 MED ORDER — SULFAMETHOXAZOLE-TRIMETHOPRIM 800-160 MG PO TABS: 1.0000 | ORAL_TABLET | Freq: Two times a day (BID) | ORAL | Status: AC

## 2015-02-27 MED ORDER — METRONIDAZOLE 500 MG PO TABS: 500.0000 mg | ORAL_TABLET | Freq: Three times a day (TID) | ORAL | Status: AC
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
## 2015-02-27 NOTE — ED Notes (Signed)
Pt reports a dog from his apt complex jumped on his chest causing him to fall hitting his head on concrete today and bit him in his R middle, ring and pinky fingers.  4 puncture marks noted.  No active bleeding noted at this time.

## 2015-02-27 NOTE — Discharge Instructions (Signed)
Ibuprofen for pain. Keep clean and dry. Wash with soap and water. Watch for any signs of infection. Take both antibiotics as prescribed until all gone for infection prevention. Ice your head. Follow up as needed. Return if any signs of infection.    Animal Bite An animal bite can result in a scratch on the skin, deep open cut, puncture of the skin, crush injury, or tearing away of the skin or a body part. Dogs are responsible for most animal bites. Children are bitten more often than adults. An animal bite can range from very mild to more serious. A small bite from your house pet is no cause for alarm. However, some animal bites can become infected or injure a bone or other tissue. You must seek medical care if:  The skin is broken and bleeding does not slow down or stop after 15 minutes.  The puncture is deep and difficult to clean (such as a cat bite).  Pain, warmth, redness, or pus develops around the wound.  The bite is from a stray animal or rodent. There may be a risk of rabies infection.  The bite is from a snake, raccoon, skunk, fox, coyote, or bat. There may be a risk of rabies infection.  The person bitten has a chronic illness such as diabetes, liver disease, or cancer, or the person takes medicine that lowers the immune system.  There is concern about the location and severity of the bite. It is important to clean and protect an animal bite wound right away to prevent infection. Follow these steps:  Clean the wound with plenty of water and soap.  Apply an antibiotic cream.  Apply gentle pressure over the wound with a clean towel or gauze to slow or stop bleeding.  Elevate the affected area above the heart to help stop any bleeding.  Seek medical care. Getting medical care within 8 hours of the animal bite leads to the best possible outcome. DIAGNOSIS  Your caregiver will most likely:  Take a detailed history of the animal and the bite injury.  Perform a wound  exam.  Take your medical history. Blood tests or X-rays may be performed. Sometimes, infected bite wounds are cultured and sent to a lab to identify the infectious bacteria.  TREATMENT  Medical treatment will depend on the location and type of animal bite as well as the patient's medical history. Treatment may include:  Wound care, such as cleaning and flushing the wound with saline solution, bandaging, and elevating the affected area.  Antibiotics.  Tetanus immunization.  Rabies immunization.  Leaving the wound open to heal. This is often done with animal bites, due to the high risk of infection. However, in certain cases, wound closure with stitches, wound adhesive, skin adhesive strips, or staples may be used. Infected bites that are left untreated may require intravenous (IV) antibiotics and surgical treatment in the hospital. HOME CARE INSTRUCTIONS  Follow your caregiver's instructions for wound care.  Take all medicines as directed.  If your caregiver prescribes antibiotics, take them as directed. Finish them even if you start to feel better.  Follow up with your caregiver for further exams or immunizations as directed. You may need a tetanus shot if:  You cannot remember when you had your last tetanus shot.  You have never had a tetanus shot.  The injury broke your skin. If you get a tetanus shot, your arm may swell, get red, and feel warm to the touch. This is common and not  a problem. If you need a tetanus shot and you choose not to have one, there is a rare chance of getting tetanus. Sickness from tetanus can be serious. SEEK MEDICAL CARE IF:  You notice warmth, redness, soreness, swelling, pus discharge, or a bad smell coming from the wound.  You have a red line on the skin coming from the wound.  You have a fever, chills, or a general ill feeling.  You have nausea or vomiting.  You have continued or worsening pain.  You have trouble moving the injured  part.  You have other questions or concerns. MAKE SURE YOU:  Understand these instructions.  Will watch your condition.  Will get help right away if you are not doing well or get worse. Document Released: 02/20/2011 Document Revised: 08/27/2011 Document Reviewed: 02/20/2011 [facility] Patient Information 2015 [place], [place]. This information is not intended to replace advice given to you by your health care provider. Make sure you discuss any questions you have with your health care provider.  Head Injury You have received a head injury. It does not appear serious at this time. Headaches and vomiting are common following head injury. It should be easy to awaken from sleeping. Sometimes it is necessary for you to stay in the emergency department for a while for observation. Sometimes admission to the hospital may be needed. After injuries such as yours, most problems occur within the first 24 hours, but side effects may occur up to 7-10 days after the injury. It is important for you to carefully monitor your condition and contact your health care provider or seek immediate medical care if there is a change in your condition. WHAT ARE THE TYPES OF HEAD INJURIES? Head injuries can be as minor as a bump. Some head injuries can be more severe. More severe head injuries include:  A jarring injury to the brain (concussion).  A bruise of the brain (contusion). This mean there is bleeding in the brain that can cause swelling.  A cracked skull (skull fracture).  Bleeding in the brain that collects, clots, and forms a bump (hematoma). WHAT CAUSES A HEAD INJURY? A serious head injury is most likely to happen to someone who is in a car wreck and is not wearing a seat belt. Other causes of major head injuries include bicycle or motorcycle accidents, sports injuries, and falls. HOW ARE HEAD INJURIES DIAGNOSED? A complete history of the event leading to the injury and your current symptoms will be helpful  in diagnosing head injuries. Many times, pictures of the brain, such as CT or MRI are needed to see the extent of the injury. Often, an overnight hospital stay is necessary for observation.  WHEN SHOULD I SEEK IMMEDIATE MEDICAL CARE?  You should get help right away if:  You have confusion or drowsiness.  You feel sick to your stomach (nauseous) or have continued, forceful vomiting.  You have dizziness or unsteadiness that is getting worse.  You have severe, continued headaches not relieved by medicine. Only take over-the-counter or prescription medicines for pain, fever, or discomfort as directed by your health care provider.  You do not have normal function of the arms or legs or are unable to walk.  You notice changes in the black spots in the center of the colored part of your eye (pupil).  You have a clear or bloody fluid coming from your nose or ears.  You have a loss of vision. During the next 24 hours after the injury, you must  stay with someone who can watch you for the warning signs. This person should contact local emergency services (911 in the [place] if you have seizures, you become unconscious, or you are unable to wake up. HOW CAN I PREVENT A HEAD INJURY IN THE FUTURE? The most important factor for preventing major head injuries is avoiding motor vehicle accidents. To minimize the potential for damage to your head, it is crucial to wear seat belts while riding in motor vehicles. Wearing helmets while bike riding and playing collision sports (like football) is also helpful. Also, avoiding dangerous activities around the house will further help reduce your risk of head injury.  WHEN CAN I RETURN TO NORMAL ACTIVITIES AND ATHLETICS? You should be reevaluated by your health care provider before returning to these activities. If you have any of the following symptoms, you should not return to activities or contact sports until 1 week after the symptoms have stopped:  Persistent  headache.  Dizziness or vertigo.  Poor attention and concentration.  Confusion.  Memory problems.  Nausea or vomiting.  Fatigue or tire easily.  Irritability.  Intolerant of bright lights or loud noises.  Anxiety or depression.  Disturbed sleep. MAKE SURE YOU:   Understand these instructions.  Will watch your condition.  Will get help right away if you are not doing well or get worse. Document Released: 06/04/2005 Document Revised: 06/09/2013 Document Reviewed: 02/09/2013 [facility] Patient Information 2015 [place], [place]. This information is not intended to replace advice given to you by your health care provider. Make sure you discuss any questions you have with your health care provider.

## 2015-02-28 ENCOUNTER — Emergency Department (HOSPITAL_COMMUNITY)
Admission: EM | Admit: 2015-02-28 | Discharge: 2015-02-28 | Discharge status: Against Medical Advice | Payer: Self-pay | Attending: Emergency Medicine | Admitting: Emergency Medicine

## 2015-02-28 ENCOUNTER — Encounter (HOSPITAL_COMMUNITY): Payer: Self-pay | Admitting: Emergency Medicine

## 2015-02-28 DIAGNOSIS — R51 Headache: Secondary | ICD-10-CM | POA: Insufficient documentation

## 2015-02-28 DIAGNOSIS — Z720 Tobacco use: Secondary | ICD-10-CM | POA: Insufficient documentation
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
## 2015-02-28 NOTE — ED Notes (Signed)
No answer when called pt; unable to locate pt

## 2015-02-28 NOTE — ED Notes (Signed)
No answer when pt's name called, unable to locate pt 

## 2015-02-28 NOTE — ED Notes (Signed)
No answer when pt's name called in the lobby, unable to locate pt, pt eloped from the lobby

## 2015-08-28 ENCOUNTER — Emergency Department (HOSPITAL_COMMUNITY)
Admission: EM | Admit: 2015-08-28 | Discharge: 2015-08-28 | Disposition: A | Discharge status: Home / Self Care | Payer: Self-pay | Attending: Emergency Medicine | Admitting: Emergency Medicine

## 2015-08-28 ENCOUNTER — Emergency Department (HOSPITAL_COMMUNITY): Payer: Self-pay

## 2015-08-28 ENCOUNTER — Encounter (HOSPITAL_COMMUNITY): Payer: Self-pay | Admitting: Emergency Medicine

## 2015-08-28 DIAGNOSIS — Y9289 Other specified places as the place of occurrence of the external cause: Secondary | ICD-10-CM | POA: Insufficient documentation

## 2015-08-28 DIAGNOSIS — W01198A Fall on same level from slipping, tripping and stumbling with subsequent striking against other object, initial encounter: Secondary | ICD-10-CM | POA: Insufficient documentation

## 2015-08-28 DIAGNOSIS — W19XXXA Unspecified fall, initial encounter: Secondary | ICD-10-CM

## 2015-08-28 DIAGNOSIS — Y998 Other external cause status: Secondary | ICD-10-CM | POA: Insufficient documentation

## 2015-08-28 DIAGNOSIS — Z79899 Other long term (current) drug therapy: Secondary | ICD-10-CM | POA: Insufficient documentation

## 2015-08-28 DIAGNOSIS — F17210 Nicotine dependence, cigarettes, uncomplicated: Secondary | ICD-10-CM | POA: Insufficient documentation

## 2015-08-28 DIAGNOSIS — Z880 Allergy status to penicillin: Secondary | ICD-10-CM | POA: Insufficient documentation

## 2015-08-28 DIAGNOSIS — Y9389 Activity, other specified: Secondary | ICD-10-CM | POA: Insufficient documentation

## 2015-08-28 DIAGNOSIS — S0993XA Unspecified injury of face, initial encounter: Secondary | ICD-10-CM | POA: Insufficient documentation

## 2015-08-28 DIAGNOSIS — S0011XA Contusion of right eyelid and periocular area, initial encounter: Secondary | ICD-10-CM | POA: Insufficient documentation

## 2015-08-28 MED ORDER — SULFAMETHOXAZOLE-TRIMETHOPRIM 800-160 MG PO TABS
1.0000 | ORAL_TABLET | Freq: Once | ORAL | Status: AC
  Administered 2015-08-28: 1 via ORAL
  Filled 2015-08-28: qty 1

## 2015-08-28 MED ORDER — LIDOCAINE HCL 2 % IJ SOLN
10.0000 mL | Freq: Once | INTRAMUSCULAR | Status: AC
Start: 2015-08-28 — End: 2015-08-28
  Administered 2015-08-28: 10 mg via INTRADERMAL
  Filled 2015-08-28: qty 20

## 2015-08-28 MED ORDER — MORPHINE SULFATE (PF) 4 MG/ML IV SOLN
4.0000 mg | Freq: Once | INTRAVENOUS | Status: AC
  Administered 2015-08-28: 4 mg via INTRAMUSCULAR
  Filled 2015-08-28: qty 1

## 2015-08-28 MED ORDER — OXYCODONE-ACETAMINOPHEN 5-325 MG PO TABS: 2.0000 | ORAL_TABLET | ORAL | Status: AC | PRN

## 2015-08-28 MED ORDER — SULFAMETHOXAZOLE-TRIMETHOPRIM 800-160 MG PO TABS: 1.0000 | ORAL_TABLET | Freq: Two times a day (BID) | ORAL | Status: AC
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
## 2015-08-28 NOTE — Discharge Instructions (Signed)
Stitches, Staples, or Adhesive Wound Closure Return for suture removal in 7 days. Cover wound while working. Apply bacitracin daily. Take antibiotics as prescribed. Health care providers use stitches (sutures), staples, and certain glue (skin adhesives) to hold skin together while it heals (wound closure). You may need this treatment after you have surgery or if you cut your skin accidentally. These methods help your skin to heal more quickly and make it less likely that you will have a scar. A wound may take several months to heal completely. The type of wound you have determines when your wound gets closed. In most cases, the wound is closed as soon as possible (primary skin closure). Sometimes, closure is delayed so the wound can be cleaned and allowed to heal naturally. This reduces the chance of infection. Delayed closure may be needed if your wound:  Is caused by a bite.  Happened more than 6 hours ago.  Involves loss of skin or the tissues under the skin.  Has dirt or debris in it that cannot be removed.  Is infected. WHAT ARE THE DIFFERENT KINDS OF WOUND CLOSURES? There are many options for wound closure. The one that your health care provider uses depends on how deep and how large your wound is. Adhesive Glue To use this type of glue to close a wound, your health care provider holds the edges of the wound together and paints the glue on the surface of your skin. You may need more than one layer of glue. Then the wound may be covered with a light bandage (dressing). This type of skin closure may be used for small wounds that are not deep (superficial). Using glue for wound closure is less painful than other methods. It does not require a medicine that numbs the area (local anesthetic). This method also leaves nothing to be removed. Adhesive glue is often used for children and on facial wounds. Adhesive glue cannot be used for wounds that are deep, uneven, or bleeding. It is not used inside  of a wound.  Adhesive Strips These strips are made of sticky (adhesive), porous paper. They are applied across your skin edges like a regular adhesive bandage. You leave them on until they fall off. Adhesive strips may be used to close very superficial wounds. They may also be used along with sutures to improve the closure of your skin edges.  Sutures Sutures are the oldest method of wound closure. Sutures can be made from natural substances, such as silk, or from synthetic materials, such as nylon and steel. They can be made from a material that your body can break down as your wound heals (absorbable), or they can be made from a material that needs to be removed from your skin (nonabsorbable). They come in many different strengths and sizes. Your health care provider attaches the sutures to a steel needle on one end. Sutures can be passed through your skin, or through the tissues beneath your skin. Then they are tied and cut. Your skin edges may be closed in one continuous stitch or in separate stitches. Sutures are strong and can be used for all kinds of wounds. Absorbable sutures may be used to close tissues under the skin. The disadvantage of sutures is that they may cause skin reactions that lead to infection. Nonabsorbable sutures need to be removed. Staples When surgical staples are used to close a wound, the edges of your skin on both sides of the wound are brought close together. A staple is placed  across the wound, and an instrument secures the edges together. Staples are often used to close surgical cuts (incisions). Staples are faster to use than sutures, and they cause less skin reaction. Staples need to be removed using a tool that bends the staples away from your skin. HOW DO I CARE FOR MY WOUND CLOSURE?  Take medicines only as directed by your health care provider.  If you were prescribed an antibiotic medicine for your wound, finish it all even if you start to feel better.  Use  ointments or creams only as directed by your health care provider.  Wash your hands with soap and water before and after touching your wound.  Do not soak your wound in water. Do not take baths, swim, or use a hot tub until your health care provider approves.  Ask your health care provider when you can start showering. Cover your wound if directed by your health care provider.  Do not take out your own sutures or staples.  Do not pick at your wound. Picking can cause an infection.  Keep all follow-up visits as directed by your health care provider. This is important. HOW LONG WILL I HAVE MY WOUND CLOSURE?  Leave adhesive glue on your skin until the glue peels away.  Leave adhesive strips on your skin until the strips fall off.  Absorbable sutures will dissolve within several days.  Nonabsorbable sutures and staples must be removed. The location of the wound will determine how long they stay in. This can range from several days to a couple of weeks. WHEN SHOULD I SEEK HELP FOR MY WOUND CLOSURE? Contact your health care provider if:  You have a fever.  You have chills.  You have drainage, redness, swelling, or pain at your wound.  There is a bad smell coming from your wound.  The skin edges of your wound start to separate after your sutures have been removed.  Your wound becomes thick, raised, and darker in color after your sutures come out (scarring).   This information is not intended to replace advice given to you by your health care provider. Make sure you discuss any questions you have with your health care provider.   Document Released: 02/27/2001 Document Revised: 06/25/2014 Document Reviewed: 11/11/2013 Elsevier Interactive Patient Education [employer]2016 [employer]

## 2015-08-28 NOTE — ED Notes (Signed)
Pt c/o trip and fall hitting face on concrete couple hours ago. Pt presents with abrasion to right cheek, lac to top lip and abrasion to forehead. Pt using ice pack reports he had it on for 1 hour. Pt instructed to remove ice pack.

## 2015-08-28 NOTE — ED Notes (Signed)
Pt placed on monitor. Pt monitored by blood pressure and pulse ox. Pt is noted to have visitor at bedside.

## 2015-09-04 ENCOUNTER — Emergency Department (HOSPITAL_COMMUNITY)
Admission: EM | Admit: 2015-09-04 | Discharge: 2015-09-04 | Disposition: A | Discharge status: Home / Self Care | Payer: Self-pay | Attending: Emergency Medicine | Admitting: Emergency Medicine

## 2015-09-04 DIAGNOSIS — Z792 Long term (current) use of antibiotics: Secondary | ICD-10-CM | POA: Insufficient documentation

## 2015-09-04 DIAGNOSIS — F17200 Nicotine dependence, unspecified, uncomplicated: Secondary | ICD-10-CM | POA: Insufficient documentation

## 2015-09-04 DIAGNOSIS — R42 Dizziness and giddiness: Secondary | ICD-10-CM | POA: Insufficient documentation

## 2015-09-04 DIAGNOSIS — Z4802 Encounter for removal of sutures: Secondary | ICD-10-CM | POA: Insufficient documentation

## 2015-09-04 DIAGNOSIS — G4489 Other headache syndrome: Secondary | ICD-10-CM | POA: Insufficient documentation

## 2015-09-04 DIAGNOSIS — Z87828 Personal history of other (healed) physical injury and trauma: Secondary | ICD-10-CM | POA: Insufficient documentation

## 2015-09-04 DIAGNOSIS — Z880 Allergy status to penicillin: Secondary | ICD-10-CM | POA: Insufficient documentation

## 2015-09-04 NOTE — ED Notes (Signed)
Headache every day since incident:"confusion, disorientation, drum-beating vice around head, .pain behind eyes , light hurts eyes.Marland Kitchen.off balance.Gustavus Messing.iritable"  Patient alert and oriented x4.  Patient answers appropriately

## 2015-09-04 NOTE — ED Provider Notes (Signed)
I initially saw the patient on 08/28/2015 when he first came to the ED for a facial injury and laceration. I was in fast track when the patient came back to have sutures removed. Patient was seen by Kerrie BuffaloHope Neese, PA-C but the patient saw me in fast track and requested to see me. I agreed. Sutures were removed by the nurse prior to me seeing the patient.  Patient complained of dizziness, being off balance, confusion at times, and pain behind the eyes. He also mentioned that he was very irritable. He was told that we could do a thorough workup but patient states that he did not want to spend money on this. I explained that there is no pill that we could give him to resolve his symptoms immediately. He most likely has a concussion from the fall. He had a CT maxillofacial done during his initial visit which was negative for acute fracture or abnormality.  I was unable to examine the patient thoroughly with a neurological exam due to the patient leaving while I had stepped out of the room to see if we could move the patient to another POD.  He admitted to me that he had been picking the scabs off of his face to clean them. I explained that this would cause scarring and that he should keep them moist with bacitracin. He was also angry over the fact that his eyelid was not healing well but admitted to picking at the scabs. He had some scar tissue formation over the right side of the upper lip which I explained he could rub several times a day to avoid the scar tissue remaining. I also explained during his initial visit that he may need a plastic surgeon in the future if his scars do not heal the way he would like them to.  Patient left AMA.  Catha GosselinHanna Patel-Mills, PA-C 09/04/15 1747  Linwood DibblesJon Knapp, MD 09/04/15 2250
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
## 2015-09-04 NOTE — ED Notes (Signed)
Patient seen in ed post fall, injuring face.  Patient had sutures placed and is here today for suture removal

## 2015-12-21 ENCOUNTER — Emergency Department (HOSPITAL_COMMUNITY)
Admission: EM | Admit: 2015-12-21 | Discharge: 2015-12-21 | Disposition: A | Discharge status: Home / Self Care | Payer: Self-pay | Attending: Emergency Medicine | Admitting: Emergency Medicine

## 2015-12-21 DIAGNOSIS — Z711 Person with feared health complaint in whom no diagnosis is made: Secondary | ICD-10-CM

## 2015-12-21 DIAGNOSIS — R319 Hematuria, unspecified: Secondary | ICD-10-CM | POA: Insufficient documentation

## 2015-12-21 DIAGNOSIS — F17200 Nicotine dependence, unspecified, uncomplicated: Secondary | ICD-10-CM | POA: Insufficient documentation

## 2015-12-21 DIAGNOSIS — R369 Urethral discharge, unspecified: Secondary | ICD-10-CM | POA: Insufficient documentation

## 2015-12-21 LAB — URINE MICROSCOPIC-ADD ON: SQUAMOUS EPITHELIAL / LPF: NONE SEEN

## 2015-12-21 LAB — URINALYSIS, ROUTINE W REFLEX MICROSCOPIC
Bilirubin Urine: NEGATIVE
GLUCOSE, UA: NEGATIVE mg/dL
KETONES UR: NEGATIVE mg/dL
Nitrite: NEGATIVE
PROTEIN: 30 mg/dL — AB
Specific Gravity, Urine: 1.026 (ref 1.005–1.030)
pH: 5 (ref 5.0–8.0)

## 2015-12-21 MED ORDER — CEFTRIAXONE SODIUM 250 MG IJ SOLR
250.0000 mg | Freq: Once | INTRAMUSCULAR | Status: AC
  Administered 2015-12-21: 250 mg via INTRAMUSCULAR
  Filled 2015-12-21: qty 250

## 2015-12-21 MED ORDER — STERILE WATER FOR INJECTION IJ SOLN
INTRAMUSCULAR | Status: AC
  Administered 2015-12-21: 10 mL
  Filled 2015-12-21: qty 10

## 2015-12-21 MED ORDER — CEPHALEXIN 500 MG PO CAPS: 500.0000 mg | ORAL_CAPSULE | Freq: Two times a day (BID) | ORAL | Status: AC

## 2015-12-21 MED ORDER — AZITHROMYCIN 250 MG PO TABS
1000.0000 mg | ORAL_TABLET | Freq: Once | ORAL | Status: AC
  Administered 2015-12-21: 1000 mg via ORAL
  Filled 2015-12-21: qty 4

## 2015-12-21 NOTE — Discharge Instructions (Signed)
Sexually Transmitted Disease °A sexually transmitted disease (STD) is a disease or infection that may be passed (transmitted) from person to person, usually during sexual activity. This may happen by way of saliva, semen, blood, vaginal mucus, or urine. Common STDs include: °· Gonorrhea. °· Chlamydia. °· Syphilis. °· HIV and AIDS. °· Genital herpes. °· Hepatitis B and C. °· Trichomonas. °· Human papillomavirus (HPV). °· Pubic lice. °· Scabies. °· Mites. °· Bacterial vaginosis. °WHAT ARE CAUSES OF STDs? °An STD may be caused by bacteria, a virus, or parasites. STDs are often transmitted during sexual activity if one person is infected. However, they may also be transmitted through nonsexual means. STDs may be transmitted after:  °· Sexual intercourse with an infected person. °· Sharing sex toys with an infected person. °· Sharing needles with an infected person or using unclean piercing or tattoo needles. °· Having intimate contact with the genitals, mouth, or rectal areas of an infected person. °· Exposure to infected fluids during birth. °WHAT ARE THE SIGNS AND SYMPTOMS OF STDs? °Different STDs have different symptoms. Some people may not have any symptoms. If symptoms are present, they may include: °· Painful or bloody urination. °· Pain in the pelvis, abdomen, vagina, anus, throat, or eyes. °· A skin rash, itching, or irritation. °· Growths, ulcerations, blisters, or sores in the genital and anal areas. °· Abnormal vaginal discharge with or without bad odor. °· Penile discharge in men. °· Fever. °· Pain or bleeding during sexual intercourse. °· Swollen glands in the groin area. °· Yellow skin and eyes (jaundice). This is seen with hepatitis. °· Swollen testicles. °· Infertility. °· Sores and blisters in the mouth. °HOW ARE STDs DIAGNOSED? °To make a diagnosis, your health care provider may: °· Take a medical history. °· Perform a physical exam. °· Take a sample of any discharge to examine. °· Swab the throat,  cervix, opening to the penis, rectum, or vagina for testing. °· Test a sample of your first morning urine. °· Perform blood tests. °· Perform a Pap test, if this applies. °· Perform a colposcopy. °· Perform a laparoscopy. °HOW ARE STDs TREATED? °Treatment depends on the STD. Some STDs may be treated but not cured. °· Chlamydia, gonorrhea, trichomonas, and syphilis can be cured with antibiotic medicine. °· Genital herpes, hepatitis, and HIV can be treated, but not cured, with prescribed medicines. The medicines lessen symptoms. °· Genital warts from HPV can be treated with medicine or by freezing, burning (electrocautery), or surgery. Warts may come back. °· HPV cannot be cured with medicine or surgery. However, abnormal areas may be removed from the cervix, vagina, or vulva. °· If your diagnosis is confirmed, your recent sexual partners need treatment. This is true even if they are symptom-free or have a negative culture or evaluation. They should not have sex until their health care providers say it is okay. °· Your health care provider may test you for infection again 3 months after treatment. °HOW CAN I REDUCE MY RISK OF GETTING AN STD? °Take these steps to reduce your risk of getting an STD: °· Use latex condoms, dental dams, and water-soluble lubricants during sexual activity. Do not use petroleum jelly or oils. °· Avoid having multiple sex partners. °· Do not have sex with someone who has other sex partners °· Do not have sex with anyone you do not know or who is at high risk for an STD. °· Avoid risky sex practices that can break your skin. °· Do not have sex   if you have open sores on your mouth or skin. °· Avoid drinking too much alcohol or taking illegal drugs. Alcohol and drugs can affect your judgment and put you in a vulnerable position. °· Avoid engaging in oral and anal sex acts. °· Get vaccinated for HPV and hepatitis. If you have not received these vaccines in the past, talk to your health care  provider about whether one or both might be right for you. °· If you are at risk of being infected with HIV, it is recommended that you take a prescription medicine daily to prevent HIV infection. This is called pre-exposure prophylaxis (PrEP). You are considered at risk if: °¨ You are a man who has sex with other men (MSM). °¨ You are a heterosexual man or woman and are sexually active with more than one partner. °¨ You take drugs by injection. °¨ You are sexually active with a partner who has HIV. °· Talk with your health care provider about whether you are at high risk of being infected with HIV. If you choose to begin PrEP, you should first be tested for HIV. You should then be tested every 3 months for as long as you are taking PrEP. °WHAT SHOULD I DO IF I THINK I HAVE AN STD? °· See your health care provider. °· Tell your sexual partner(s). They should be tested and treated for any STDs. °· Do not have sex until your health care provider says it is okay. °WHEN SHOULD I GET IMMEDIATE MEDICAL CARE? °Contact your health care provider right away if:  °· You have severe abdominal pain. °· You are a man and notice swelling or pain in your testicles. °· You are a woman and notice swelling or pain in your vagina. °  °This information is not intended to replace advice given to you by your health care provider. Make sure you discuss any questions you have with your health care provider. °  °Document Released: 08/25/2002 Document Revised: 06/25/2014 Document Reviewed: 12/23/2012 °Elsevier Interactive Patient Education ©2016 [employer] ° °

## 2015-12-21 NOTE — ED Notes (Signed)
Pt states he started noticing penile discharge on Monday. Wants STD checked. Alert and oriented.

## 2015-12-21 NOTE — ED Notes (Signed)
Patient informed about need for urine sample, reports unable to collect sample at this time. Will try back in .
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
## 2015-12-22 LAB — GC/CHLAMYDIA PROBE AMP (~~LOC~~) NOT AT ARMC
Chlamydia: NEGATIVE
NEISSERIA GONORRHEA: POSITIVE — AB

## 2015-12-22 LAB — HIV ANTIBODY (ROUTINE TESTING W REFLEX): HIV Screen 4th Generation wRfx: NONREACTIVE

## 2015-12-22 LAB — RPR: RPR Ser Ql: NONREACTIVE

## 2015-12-23 ENCOUNTER — Telehealth: Payer: Self-pay | Admitting: *Deleted

## 2015-12-23 LAB — URINE CULTURE

## 2015-12-23 NOTE — ED Notes (Signed)
Spoke with patient, verified ID, informed of labs, treated per protocol, DHHS form faxed, patient informed to abstain for sexual activity x 10 days and notify sexual partners for evaluation and treatment 

## 2016-08-28 IMAGING — CR DG WRIST COMPLETE 3+V*R*
4 series · 4 of 4 positions shown · non-contrast
Comparison: None.

CLINICAL DATA: Fall

EXAM:
RIGHT WRIST - COMPLETE 3+ VIEW

[wrist pa]
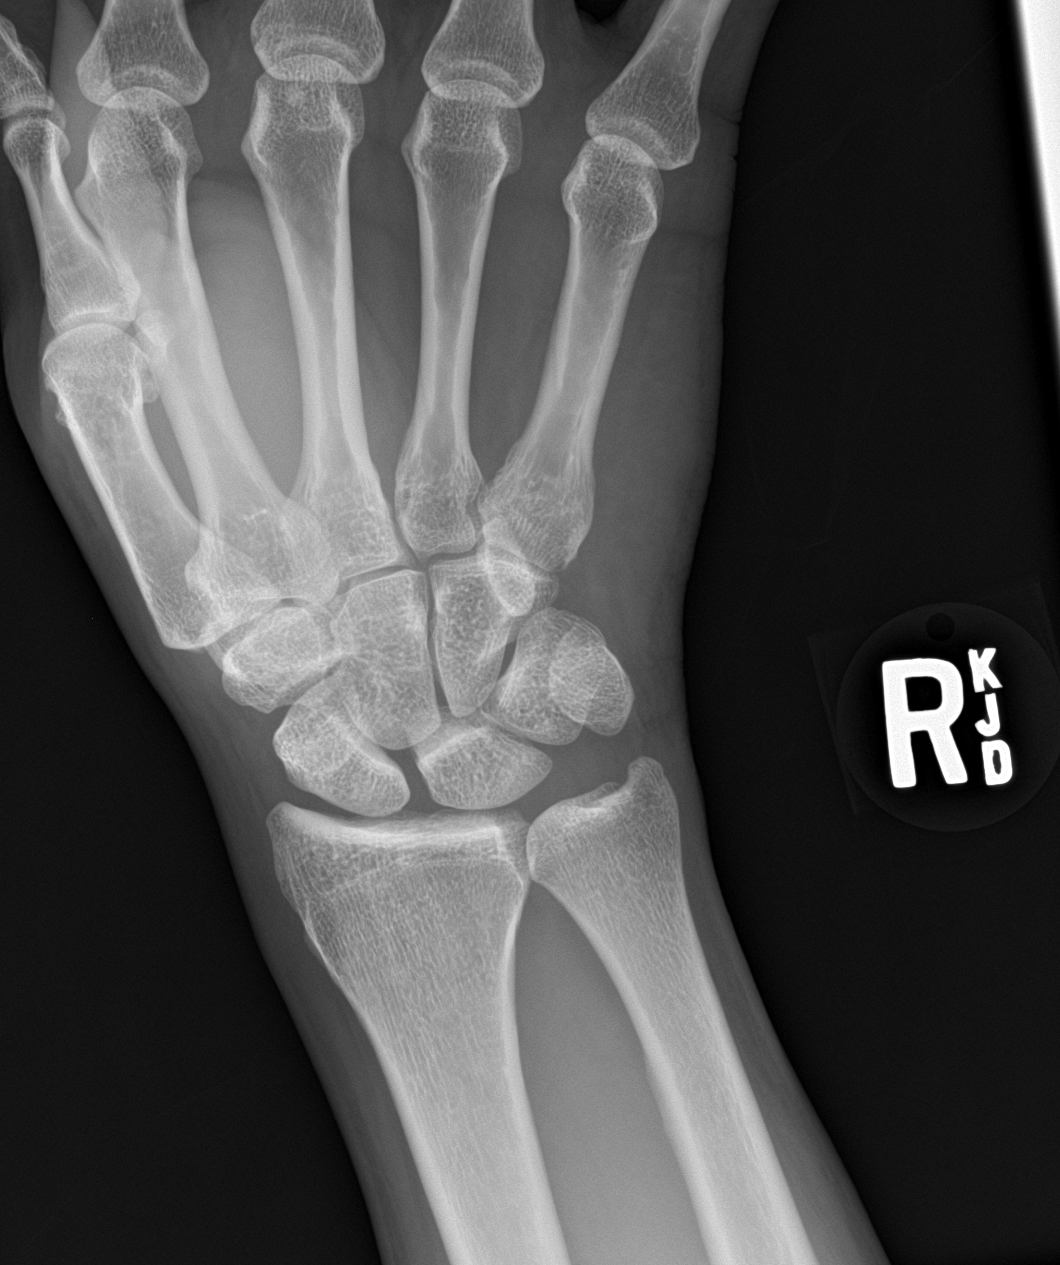

[wrist obl]
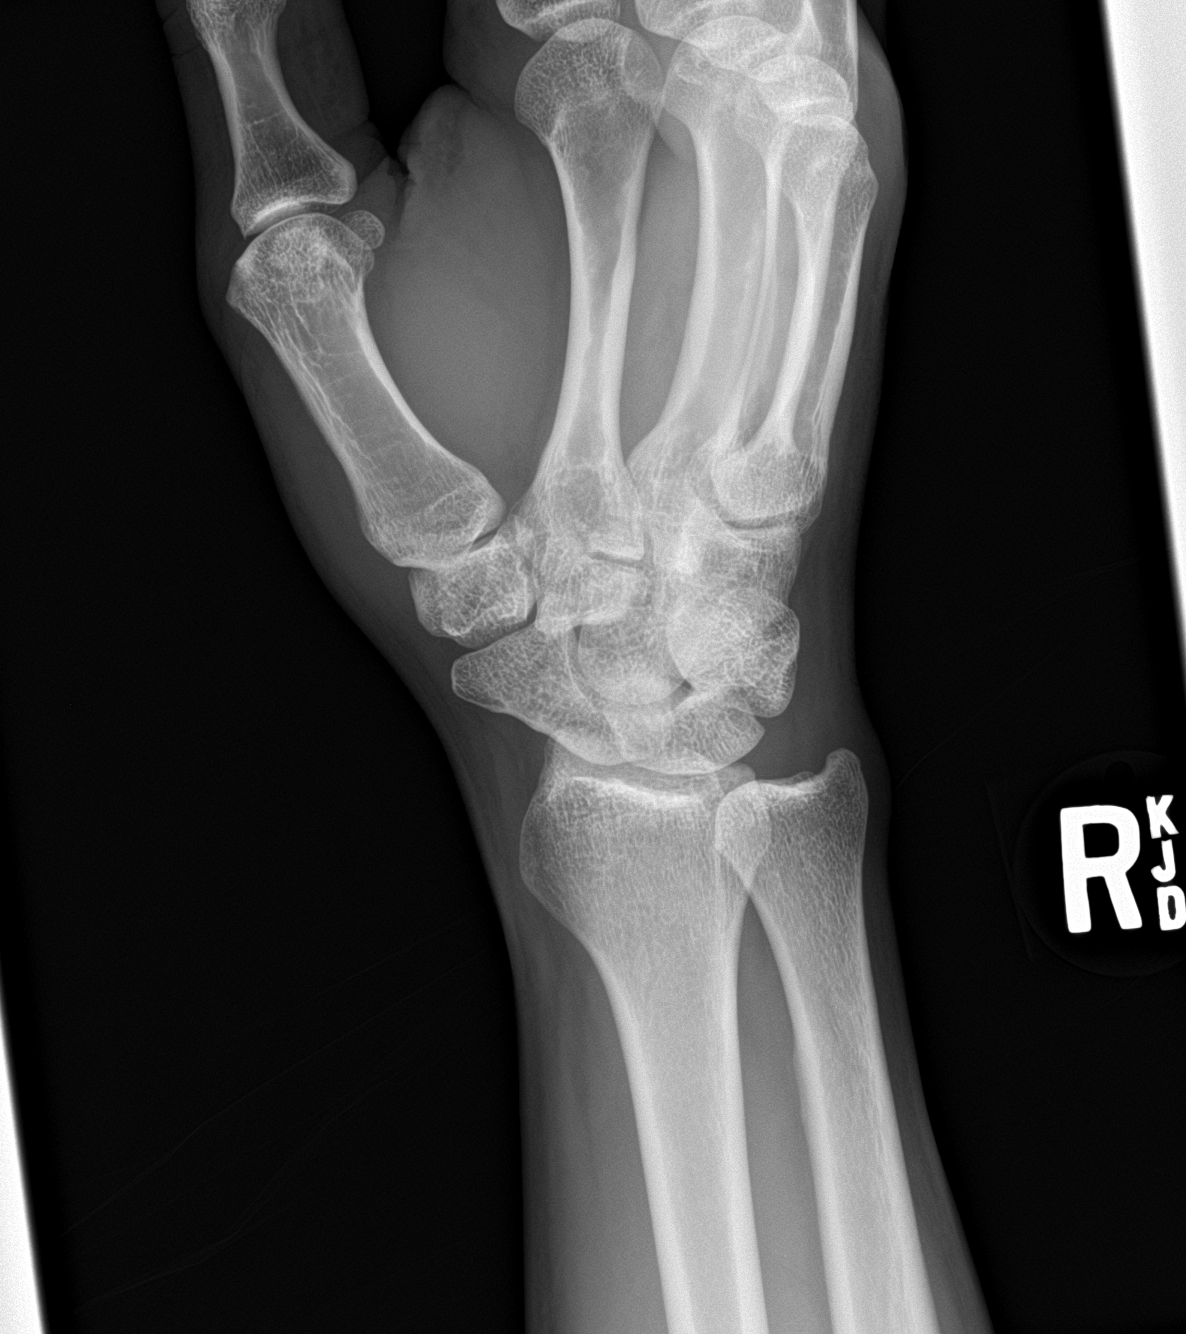

[wrist lat]
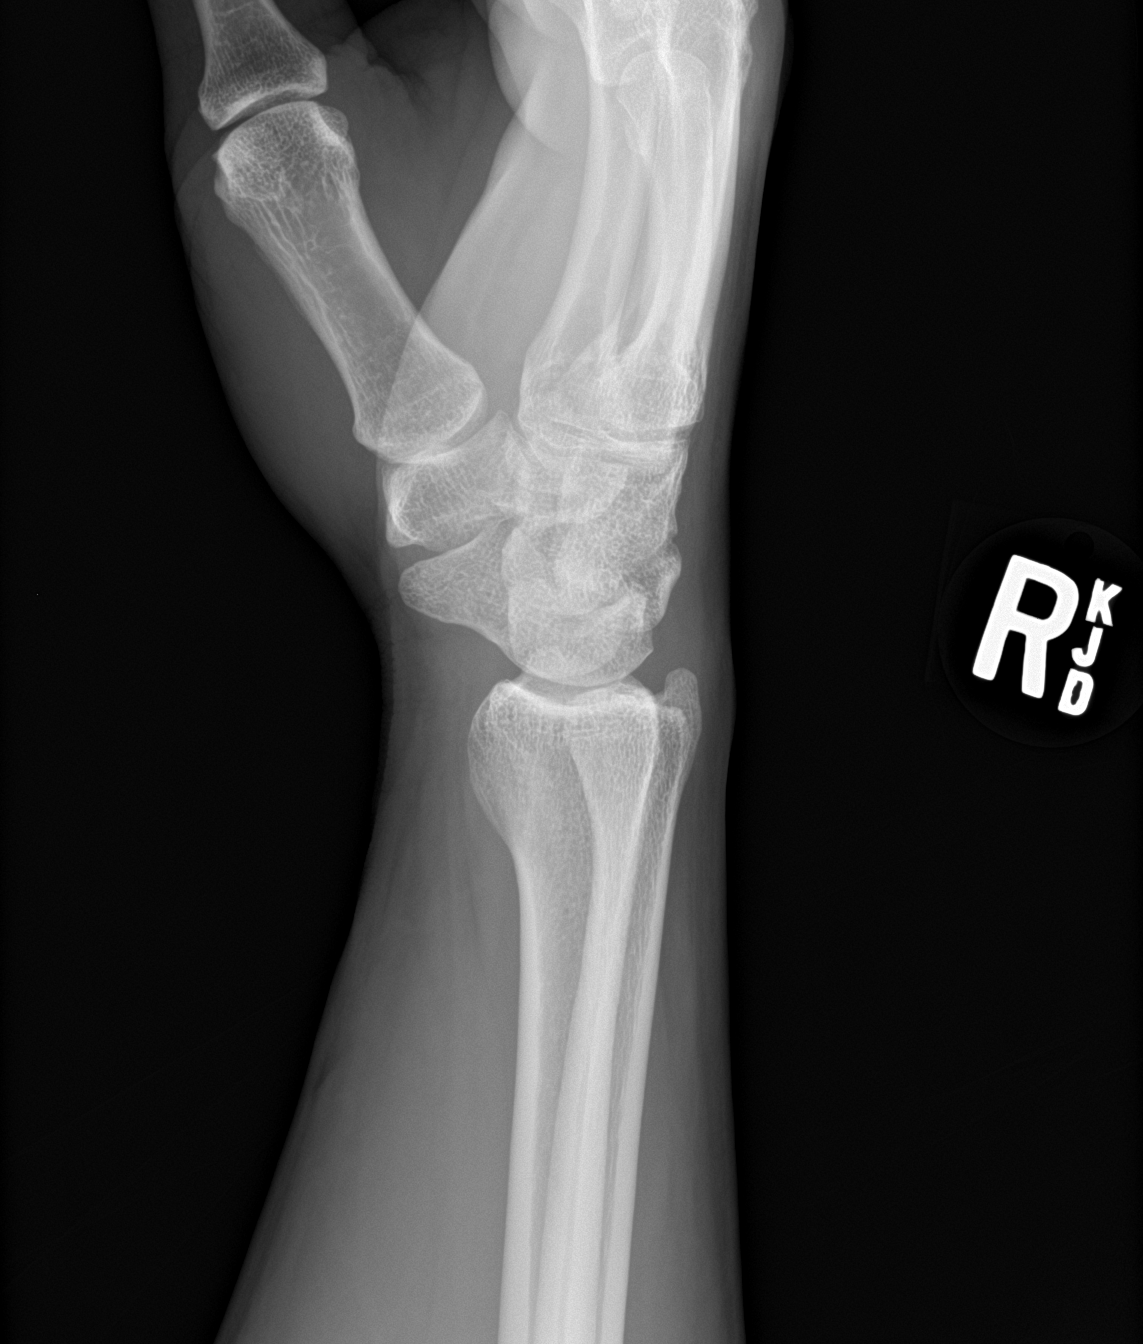

[wrist navicular]
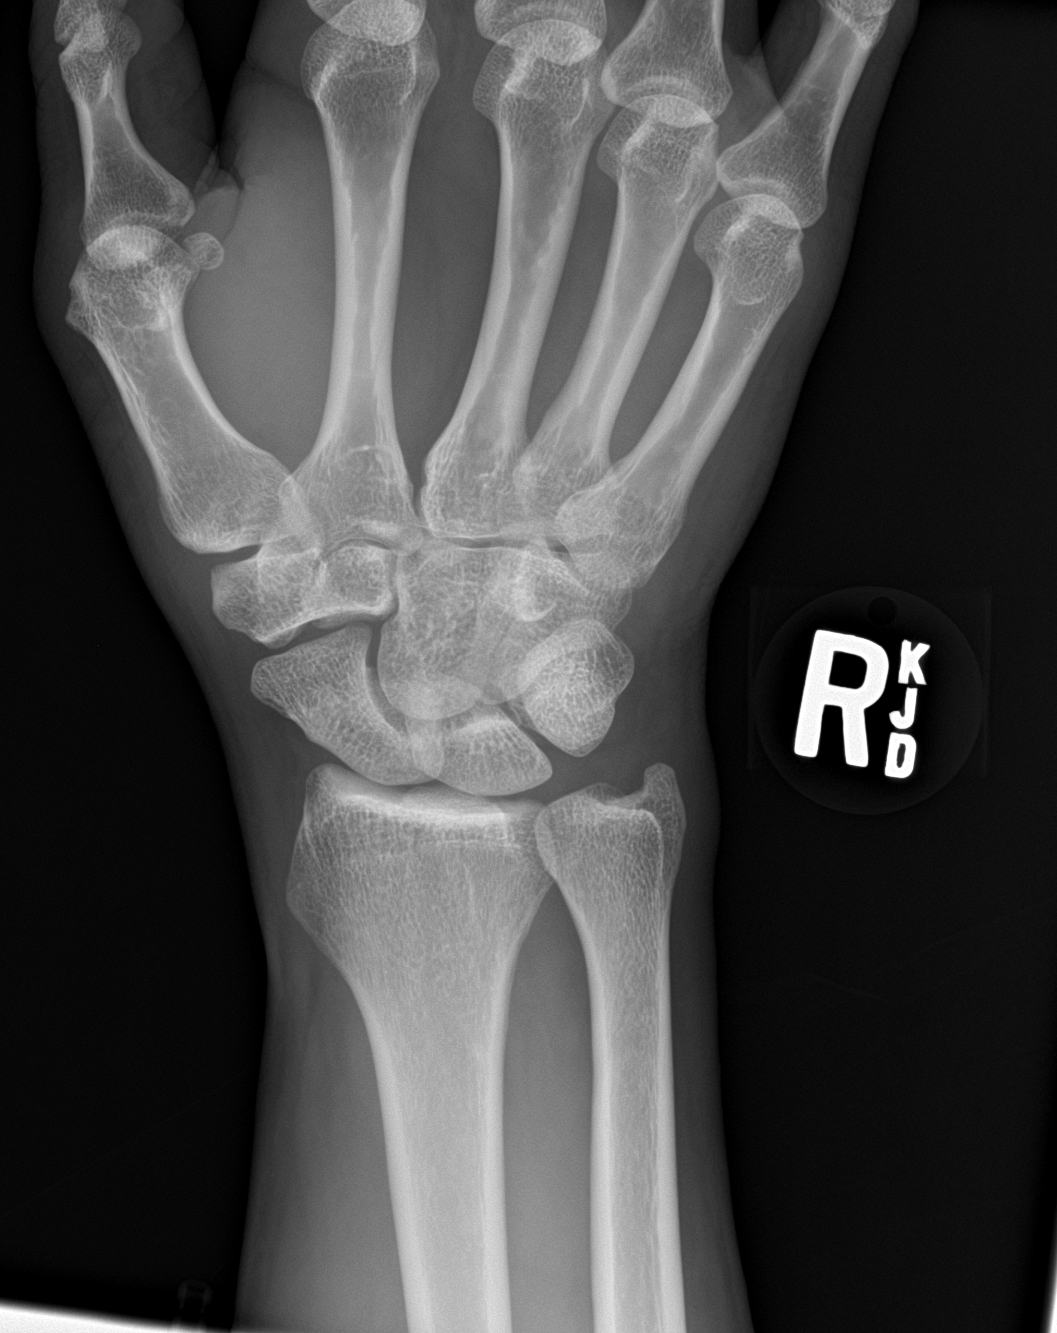

[4 of 4 positions shown; findings below may reference images not displayed]

FINDINGS: No fracture or dislocation is seen.

The joint spaces are preserved.

Visualized soft tissues are within normal limits.
IMPRESSION: No fracture or dislocation is seen.

## 2024-07-17 ENCOUNTER — Emergency Department (HOSPITAL_COMMUNITY)
Admission: EM | Admit: 2024-07-17 | Discharge: 2024-07-18 | Payer: Self-pay | Attending: Emergency Medicine | Admitting: Emergency Medicine

## 2024-07-17 ENCOUNTER — Encounter (HOSPITAL_COMMUNITY): Payer: Self-pay | Admitting: Emergency Medicine

## 2024-07-17 ENCOUNTER — Other Ambulatory Visit: Payer: Self-pay

## 2024-07-17 DIAGNOSIS — Z5321 Procedure and treatment not carried out due to patient leaving prior to being seen by health care provider: Secondary | ICD-10-CM | POA: Insufficient documentation

## 2024-07-17 DIAGNOSIS — R197 Diarrhea, unspecified: Secondary | ICD-10-CM | POA: Insufficient documentation

## 2024-07-17 DIAGNOSIS — R1084 Generalized abdominal pain: Secondary | ICD-10-CM | POA: Insufficient documentation

## 2024-07-17 DIAGNOSIS — R1110 Vomiting, unspecified: Secondary | ICD-10-CM | POA: Insufficient documentation

## 2024-07-17 LAB — URINALYSIS, ROUTINE W REFLEX MICROSCOPIC
Bacteria, UA: NONE SEEN
Bilirubin Urine: NEGATIVE
Glucose, UA: NEGATIVE mg/dL
Hgb urine dipstick: NEGATIVE
Ketones, ur: NEGATIVE mg/dL
Nitrite: NEGATIVE
Protein, ur: NEGATIVE mg/dL
Specific Gravity, Urine: 1.018 (ref 1.005–1.030)
pH: 5 (ref 5.0–8.0)

## 2024-07-17 LAB — COMPREHENSIVE METABOLIC PANEL WITH GFR
ALT: 14 U/L (ref 0–44)
AST: 25 U/L (ref 15–41)
Albumin: 4.5 g/dL (ref 3.5–5.0)
Alkaline Phosphatase: 62 U/L (ref 38–126)
Anion gap: 10 (ref 5–15)
BUN: 11 mg/dL (ref 6–20)
CO2: 27 mmol/L (ref 22–32)
Calcium: 9.4 mg/dL (ref 8.9–10.3)
Chloride: 100 mmol/L (ref 98–111)
Creatinine, Ser: 1.1 mg/dL (ref 0.61–1.24)
GFR, Estimated: >60 mL/min
Glucose, Bld: 87 mg/dL (ref 70–99)
Potassium: 3.6 mmol/L (ref 3.5–5.1)
Sodium: 138 mmol/L (ref 135–145)
Total Bilirubin: 0.7 mg/dL (ref 0.0–1.2)
Total Protein: 7.2 g/dL (ref 6.5–8.1)

## 2024-07-17 LAB — CBC
HCT: 40.3 % (ref 39.0–52.0)
Hemoglobin: 13.5 g/dL (ref 13.0–17.0)
MCH: 29.3 pg (ref 26.0–34.0)
MCHC: 33.5 g/dL (ref 30.0–36.0)
MCV: 87.6 fL (ref 80.0–100.0)
Platelets: 307 10*3/uL (ref 150–400)
RBC: 4.6 MIL/uL (ref 4.22–5.81)
RDW: 11.4 % — ABNORMAL LOW (ref 11.5–15.5)
WBC: 8.5 10*3/uL (ref 4.0–10.5)
nRBC: 0 % (ref 0.0–0.2)

## 2024-07-17 LAB — LIPASE, BLOOD: Lipase: 32 U/L (ref 11–51)

## 2024-07-17 NOTE — ED Triage Notes (Signed)
 Pt states that he has had generalized abdominal pain with vomiting and diarrhea x 2-3 days.

## 2024-07-18 NOTE — ED Notes (Signed)
Pt name called 3x for updated vitals, no response
